# Patient Record
Sex: Female | Born: 1995 | Race: Black or African American | Hispanic: No | Marital: Single | Smoking: Never smoker
Health system: Southern US, Community
[De-identification: ages and names within clinical notes are randomized; demographics above are authoritative.]

## PROBLEM LIST (undated history)

## (undated) ENCOUNTER — Inpatient Hospital Stay (HOSPITAL_COMMUNITY): Payer: Self-pay

## (undated) DIAGNOSIS — E079 Disorder of thyroid, unspecified: Secondary | ICD-10-CM

## (undated) DIAGNOSIS — Z789 Other specified health status: Secondary | ICD-10-CM

## (undated) DIAGNOSIS — D649 Anemia, unspecified: Secondary | ICD-10-CM

## (undated) HISTORY — PX: NO PAST SURGERIES: SHX2092

## (undated) HISTORY — DX: Other specified health status: Z78.9

## (undated) HISTORY — DX: Anemia, unspecified: D64.9

## (undated) HISTORY — PX: WISDOM TOOTH EXTRACTION: SHX21

---
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
## 2017-07-07 ENCOUNTER — Ambulatory Visit: Payer: Medicaid Other | Attending: Internal Medicine | Admitting: Internal Medicine

## 2017-07-07 ENCOUNTER — Encounter: Payer: Self-pay | Admitting: Internal Medicine

## 2017-07-07 VITALS — BP 133/84 | HR 91 | Temp 98.3°F | Resp 16 | Wt 158.4 lb

## 2017-07-07 DIAGNOSIS — Z23 Encounter for immunization: Secondary | ICD-10-CM | POA: Insufficient documentation

## 2017-07-07 DIAGNOSIS — Z30011 Encounter for initial prescription of contraceptive pills: Secondary | ICD-10-CM

## 2017-07-07 DIAGNOSIS — F1290 Cannabis use, unspecified, uncomplicated: Secondary | ICD-10-CM

## 2017-07-07 DIAGNOSIS — Z30432 Encounter for removal of intrauterine contraceptive device: Secondary | ICD-10-CM | POA: Diagnosis not present

## 2017-07-07 DIAGNOSIS — Z308 Encounter for other contraceptive management: Secondary | ICD-10-CM | POA: Diagnosis not present

## 2017-07-07 DIAGNOSIS — Z124 Encounter for screening for malignant neoplasm of cervix: Secondary | ICD-10-CM | POA: Insufficient documentation

## 2017-07-07 DIAGNOSIS — Z2821 Immunization not carried out because of patient refusal: Secondary | ICD-10-CM

## 2017-07-07 DIAGNOSIS — Z3009 Encounter for other general counseling and advice on contraception: Secondary | ICD-10-CM | POA: Insufficient documentation

## 2017-07-07 HISTORY — DX: Cannabis use, unspecified, uncomplicated: F12.90

## 2017-07-07 LAB — POCT URINE PREGNANCY: PREG TEST UR: NEGATIVE

## 2017-07-07 MED ORDER — NORGESTIM-ETH ESTRAD TRIPHASIC 0.18/0.215/0.25 MG-35 MCG PO TABS: 1.0000 | ORAL_TABLET | Freq: Every day | ORAL | 11 refills | Status: DC

## 2017-07-07 NOTE — Progress Notes (Signed)
Patient ID: Mia CantorJanecia Resende, female    DOB: [birth year]  MRN: [id number]  CC: New Patient (Initial Visit) and Contraception (pill )   Subjective: Mia Mccoy is a 21 y.o. female who presents for new pt visit. Sister is with her. Her concerns today include:   Patient presents today requesting to have Mirena IUD removed and to be put on birth control pills instead. She is also due for a Pap smear and would like STD screening Pt is G0P0. -She has had Mirena in place for 5 years. She would like to have it removed because it causes intermittent sharp pains. She is not interested in having birth control implant -want to go to BCP -sexually active. 1 partner. Last sexual activity was in July 2018 -no vag dischg -random periods with Mirena - 2x this mth. Last few days. -no abnormal paps in past  Patient Active Problem List   Diagnosis Date Noted  . Immunization due 07/07/2017  . Family planning 07/07/2017     No current outpatient prescriptions on file prior to visit.   No current facility-administered medications on file prior to visit.     No Known Allergies  Social History   Social History  . Marital status: Single    Spouse name: N/A  . Number of children: N/A  . Years of education: N/A   Occupational History  . Not on file.   Social History Main Topics  . Smoking status: Never Smoker  . Smokeless tobacco: Never Used     Comment: Marijuana  . Alcohol use No  . Drug use: Yes    Types: Marijuana  . Sexual activity: Yes    Birth control/ protection: IUD   Other Topics Concern  . Not on file   Social History Narrative  . No narrative on file    History reviewed. No pertinent family history.  History reviewed. No pertinent surgical history.  ROS: Review of Systems  Constitutional: Negative.   Respiratory: Negative for chest tightness and shortness of breath.   Cardiovascular: Negative for chest pain.  Gastrointestinal: Negative for abdominal pain.    Hematological:       No past history of blood clots in the legs or lungs    PHYSICAL EXAM: BP 133/84   Pulse 91   Temp 98.3 F (36.8 C) (Oral)   Resp 16   Wt 158 lb 6.4 oz (71.8 kg)   SpO2 99%   110/60 Physical Exam General appearance - alert, well appearing, and in no distress Mental status - alert, oriented to person, place, and time, normal mood, behavior, speech, dress, motor activity, and thought processes Mouth - mucous membranes moist, pharynx normal without lesions Neck - supple, no significant adenopathy Chest - clear to auscultation, no wheezes, rales or rhonchi, symmetric air entry Heart - normal rate, regular rhythm, normal S1, S2, no murmurs, rubs, clicks or gallops Abdomen - soft, nontender, nondistended, no masses or organomegaly Breasts - breasts appear normal, no suspicious masses, no skin or nipple changes or axillary nodes Pelvic - CMA Pollock present: normal external genitalia, vulva, vagina, cervix, uterus and adnexa. IUD string was outside cervical oz. Using pelvic forceps, this was successfully pulled out in one piece and removed by PA MCclung. Pt tolerate procedure well Extremities -no LE edema   Results for orders placed or performed in visit on 07/07/17  POCT urine pregnancy  Result Value Ref Range   Preg Test, Ur Negative Negative    ASSESSMENT AND PLAN: 1.  Family planning We discussed birth control options. Patient advised that long-acting birth control has advantage of not having to remember to take birth control pills. She is not interested in implant or Depo-Medrol. She does not have any contraindication for birth control pills. -Patient can start birth control pills today. I went over with her how to take the pills -Patient advised of slight risk of developing blood clots in legs or lungs while on birth control. I went over signs and symptoms that should alert her to be seen. -IUD removed. - POCT urine pregnancy - HIV antibody (with reflex) -  Norgestimate-Ethinyl Estradiol Triphasic (ORTHO TRI-CYCLEN, 28,) 0.18/0.215/0.25 MG-35 MCG tablet; Take 1 tablet by mouth daily.  Dispense: 1 Package; Refill: 11  2. Encounter for removal of intrauterine contraceptive device (IUD) -successfully removed  3. Pap smear for cervical cancer screening - Cytology - PAP  4. Immunization due Tdap give  5. Influenza vaccination declined   6. Marijuana use -Counseled on quitting   Patient was given the opportunity to ask questions.  Patient verbalized understanding of the plan and was able to repeat key elements of the plan.   Orders Placed This Encounter  Procedures  . Tdap vaccine greater than or equal to 7yo IM  . HIV antibody (with reflex)  . POCT urine pregnancy     Requested Prescriptions   Signed Prescriptions Disp Refills  . Norgestimate-Ethinyl Estradiol Triphasic (ORTHO TRI-CYCLEN, 28,) 0.18/0.215/0.25 MG-35 MCG tablet 1 Package 11    Sig: Take 1 tablet by mouth daily.    Return if symptoms worsen or fail to improve.  Jonah Blue, MD, FACP

## 2017-07-07 NOTE — Patient Instructions (Signed)
Td Vaccine (Tetanus and Diphtheria): What You Need to Know 1. Why get vaccinated? Tetanus  and diphtheria are very serious diseases. They are rare in the [place] today, but people who do become infected often have severe complications. Td vaccine is used to protect adolescents and adults from both of these diseases. Both tetanus and diphtheria are infections caused by bacteria. Diphtheria spreads from person to person through coughing or sneezing. Tetanus-causing bacteria enter the body through cuts, scratches, or wounds. TETANUS (lockjaw) causes painful muscle tightening and stiffness, usually all over the body.  It can lead to tightening of muscles in the head and neck so you can't open your mouth, swallow, or sometimes even breathe. Tetanus kills about 1 out of every 10 people who are infected even after receiving the best medical care.  DIPHTHERIA can cause a thick coating to form in the back of the throat.  It can lead to breathing problems, paralysis, heart failure, and death.  Before vaccines, as many as 200,000 cases of diphtheria and hundreds of cases of tetanus were reported in the [place] each year. Since vaccination began, reports of cases for both diseases have dropped by about 99%. 2. Td vaccine Td vaccine can protect adolescents and adults from tetanus and diphtheria. Td is usually given as a booster dose every 10 years but it can also be given earlier after a severe and dirty wound or burn. Another vaccine, called Tdap, which protects against pertussis in addition to tetanus and diphtheria, is sometimes recommended instead of Td vaccine. Your doctor or the person giving you the vaccine can give you more information. Td may safely be given at the same time as other vaccines. 3. Some people should not get this vaccine  A person who has ever had a life-threatening allergic reaction after a previous dose of any tetanus or diphtheria containing vaccine, OR has a severe  allergy to any part of this vaccine, should not get Td vaccine. Tell the person giving the vaccine about any severe allergies.  Talk to your doctor if you: ? had severe pain or swelling after any vaccine containing diphtheria or tetanus, ? ever had a condition called Guillain Barre Syndrome (GBS), ? aren't feeling well on the day the shot is scheduled. 4. What are the risks from Td vaccine? With any medicine, including vaccines, there is a chance of side effects. These are usually mild and go away on their own. Serious reactions are also possible but are rare. Most people who get Td vaccine do not have any problems with it. Mild problems following Td vaccine: (Did not interfere with activities)  Pain where the shot was given (about 8 people in 10)  Redness or swelling where the shot was given (about 1 person in 4)  Mild fever (rare)  Headache (about 1 person in 4)  Tiredness (about 1 person in 4)  Moderate problems following Td vaccine: (Interfered with activities, but did not require medical attention)  Fever over 102F (rare)  Severe problems following Td vaccine: (Unable to perform usual activities; required medical attention)  Swelling, severe pain, bleeding and/or redness in the arm where the shot was given (rare).  Problems that could happen after any vaccine:  People sometimes faint after a medical procedure, including vaccination. Sitting or lying down for about 15 minutes can help prevent fainting, and injuries caused by a fall. Tell your doctor if you feel dizzy, or have vision changes or ringing in the ears.  Some people get   severe pain in the shoulder and have difficulty moving the arm where a shot was given. This happens very rarely.  Any medication can cause a severe allergic reaction. Such reactions from a vaccine are very rare, estimated at fewer than 1 in a million doses, and would happen within a few minutes to a few hours after the vaccination. As with any  medicine, there is a very remote chance of a vaccine causing a serious injury or death. The safety of vaccines is always being monitored. For more information, visit: www.cdc.gov/vaccinesafety/ 5. What if there is a serious reaction? What should I look for? Look for anything that concerns you, such as signs of a severe allergic reaction, very high fever, or unusual behavior. Signs of a severe allergic reaction can include hives, swelling of the face and throat, difficulty breathing, a fast heartbeat, dizziness, and weakness. These would usually start a few minutes to a few hours after the vaccination. What should I do?  If you think it is a severe allergic reaction or other emergency that can't wait, call 9-1-1 or get the person to the nearest hospital. Otherwise, call your doctor.  Afterward, the reaction should be reported to the Vaccine Adverse Event Reporting System (VAERS). Your doctor might file this report, or you can do it yourself through the VAERS web site at www.vaers.hhs.gov, or by calling 1-800-822-7967. ? VAERS does not give medical advice. 6. The National Vaccine Injury Compensation Program The National Vaccine Injury Compensation Program (VICP) is a federal program that was created to compensate people who may have been injured by certain vaccines. Persons who believe they may have been injured by a vaccine can learn about the program and about filing a claim by calling 1-800-338-2382 or visiting the VICP website at www.hrsa.gov/vaccinecompensation. There is a time limit to file a claim for compensation. 7. How can I learn more?  Ask your doctor. He or she can give you the vaccine package insert or suggest other sources of information.  Call your local or state health department.  Contact the Centers for Disease Control and Prevention (CDC): ? Call 1-800-232-4636 (1-800-CDC-INFO) ? Visit CDC's website at www.cdc.gov/vaccines CDC Td Vaccine VIS (12/18/15) This information is  not intended to replace advice given to you by your health care provider. Make sure you discuss any questions you have with your health care provider. Document Released: 06/22/2006 Document Revised: 05/15/2016 Document Reviewed: 05/15/2016 Elsevier Interactive Patient Education  2017 Elsevier Inc.  

## 2017-07-08 LAB — HIV ANTIBODY (ROUTINE TESTING W REFLEX): HIV Screen 4th Generation wRfx: NONREACTIVE

## 2017-07-09 ENCOUNTER — Other Ambulatory Visit: Payer: Self-pay | Admitting: Internal Medicine

## 2017-07-09 LAB — CYTOLOGY - PAP
BACTERIAL VAGINITIS: POSITIVE — AB
CANDIDA VAGINITIS: NEGATIVE
CHLAMYDIA, DNA PROBE: NEGATIVE
Diagnosis: NEGATIVE
HPV: NOT DETECTED
NEISSERIA GONORRHEA: NEGATIVE
TRICH (WINDOWPATH): NEGATIVE

## 2017-07-09 MED ORDER — METRONIDAZOLE 500 MG PO TABS: 500.0000 mg | ORAL_TABLET | Freq: Two times a day (BID) | ORAL | 0 refills | Status: DC

## 2017-07-10 ENCOUNTER — Telehealth: Payer: Self-pay

## 2017-07-10 NOTE — Telephone Encounter (Signed)
Contacted pt to go over pap and lab results pt is aware and doesn't have any questions or concerns

## 2018-06-23 ENCOUNTER — Encounter (HOSPITAL_COMMUNITY): Payer: Self-pay | Admitting: *Deleted

## 2018-06-23 ENCOUNTER — Inpatient Hospital Stay (HOSPITAL_COMMUNITY)
Admission: AD | Admit: 2018-06-23 | Discharge: 2018-06-23 | Disposition: A | Discharge status: Home / Self Care | Payer: Self-pay | Source: Ambulatory Visit | Attending: Obstetrics and Gynecology | Admitting: Obstetrics and Gynecology

## 2018-06-23 ENCOUNTER — Inpatient Hospital Stay (HOSPITAL_COMMUNITY): Payer: Self-pay

## 2018-06-23 ENCOUNTER — Other Ambulatory Visit: Payer: Self-pay

## 2018-06-23 DIAGNOSIS — O418X10 Other specified disorders of amniotic fluid and membranes, first trimester, not applicable or unspecified: Secondary | ICD-10-CM

## 2018-06-23 DIAGNOSIS — Z79899 Other long term (current) drug therapy: Secondary | ICD-10-CM | POA: Insufficient documentation

## 2018-06-23 DIAGNOSIS — O219 Vomiting of pregnancy, unspecified: Secondary | ICD-10-CM | POA: Insufficient documentation

## 2018-06-23 DIAGNOSIS — R1030 Lower abdominal pain, unspecified: Secondary | ICD-10-CM | POA: Insufficient documentation

## 2018-06-23 DIAGNOSIS — O468X1 Other antepartum hemorrhage, first trimester: Secondary | ICD-10-CM

## 2018-06-23 DIAGNOSIS — O26891 Other specified pregnancy related conditions, first trimester: Secondary | ICD-10-CM | POA: Insufficient documentation

## 2018-06-23 DIAGNOSIS — Z3A01 Less than 8 weeks gestation of pregnancy: Secondary | ICD-10-CM | POA: Insufficient documentation

## 2018-06-23 DIAGNOSIS — R109 Unspecified abdominal pain: Secondary | ICD-10-CM

## 2018-06-23 DIAGNOSIS — O208 Other hemorrhage in early pregnancy: Secondary | ICD-10-CM | POA: Insufficient documentation

## 2018-06-23 DIAGNOSIS — Z3491 Encounter for supervision of normal pregnancy, unspecified, first trimester: Secondary | ICD-10-CM

## 2018-06-23 HISTORY — DX: Disorder of thyroid, unspecified: E07.9

## 2018-06-23 LAB — URINALYSIS, ROUTINE W REFLEX MICROSCOPIC
BILIRUBIN URINE: NEGATIVE
Glucose, UA: NEGATIVE mg/dL
Hgb urine dipstick: NEGATIVE
KETONES UR: 20 mg/dL — AB
Leukocytes, UA: NEGATIVE
NITRITE: NEGATIVE
PH: 5 (ref 5.0–8.0)
Protein, ur: NEGATIVE mg/dL
SPECIFIC GRAVITY, URINE: 1.021 (ref 1.005–1.030)

## 2018-06-23 LAB — WET PREP, GENITAL
CLUE CELLS WET PREP: NONE SEEN
Sperm: NONE SEEN
Trich, Wet Prep: NONE SEEN
Yeast Wet Prep HPF POC: NONE SEEN

## 2018-06-23 LAB — CBC
HCT: 41.5 % (ref 36.0–46.0)
Hemoglobin: 13.6 g/dL (ref 12.0–15.0)
MCH: 25.7 pg — AB (ref 26.0–34.0)
MCHC: 32.8 g/dL (ref 30.0–36.0)
MCV: 78.4 fL — ABNORMAL LOW (ref 80.0–100.0)
PLATELETS: 282 10*3/uL (ref 150–400)
RBC: 5.29 MIL/uL — AB (ref 3.87–5.11)
RDW: 14.4 % (ref 11.5–15.5)
WBC: 8 10*3/uL (ref 4.0–10.5)
nRBC: 0 % (ref 0.0–0.2)

## 2018-06-23 LAB — HCG, QUANTITATIVE, PREGNANCY: HCG, BETA CHAIN, QUANT, S: 69879 m[IU]/mL — AB (ref <5)

## 2018-06-23 LAB — POCT PREGNANCY, URINE: PREG TEST UR: POSITIVE — AB

## 2018-06-23 LAB — ABO/RH: ABO/RH(D): O POS

## 2018-06-23 MED ORDER — DOXYLAMINE-PYRIDOXINE 10-10 MG PO TBEC: DELAYED_RELEASE_TABLET | ORAL | 5 refills | Status: DC

## 2018-06-23 MED ORDER — METOCLOPRAMIDE HCL 10 MG PO TABS: 10.0000 mg | ORAL_TABLET | Freq: Three times a day (TID) | ORAL | 2 refills | Status: DC

## 2018-06-23 NOTE — MAU Note (Addendum)
Pt c/o lower abdominal cramping on/off for approx two days; however no cramping today. Pain 5/10 when occurs. No vaginal bldg or d/c noted. Pt with occ HA's and nausea. Denies any other sx's at this time.   Adah Perl RN

## 2018-06-23 NOTE — Discharge Instructions (Signed)
[facility]  ° ° °[facility]       Phone: [phone] ° °[facility] Phone: [phone] ° °[facility] [place]  Phone: [phone] ° °[facility] [place]  Phone: [phone] ° °[facility] [place]  Phone: [phone] ° °[facility]       Phone: [phone] ° °[facility]    Phone: [phone]  ° °[facility] ([place]    Phone: [phone] ° °[facility]    Phone: [phone] ° °[facility]    Phone: [phone] ° °[facility]    Phone: [phone] ° °[facility]       Phone: [phone]  ° °[facility]  Phone: [phone] ° °[facility]    Phone: [phone] ° °[facility]   Phone: [phone] ° °[facility]      Phone: [phone] ° °[facility]    Phone: [phone] ° °

## 2018-06-23 NOTE — MAU Note (Signed)
Having pain in lower back and lower abd, going on for a few wks (once in a while, not currently). Cramps- feels like her period is going to start. Boobs are sore.  Might be pregnant, has not done a home test.

## 2018-06-23 NOTE — MAU Provider Note (Signed)
Chief Complaint: Abdominal Pain; Back Pain; and Possible Pregnancy   First Provider Initiated Contact with Patient 06/23/18 0839      SUBJECTIVE HPI: Mia Mccoy is a 22 y.o. G1P0 at [redacted]w[redacted]d by LMP who presents to maternity admissions reporting back and abdominal pain x 1 week, intermittent, last episode early this morning at 4 am.  The pain is low in the front of her abdomen, sharp and pressure pain. She has not tried any treatments. She is unsure of her LMP and was taking OCPs but missing pills often so having breakthrough spotting.  She has not taken a pregnancy test but suspects pregnancy. She reports a mild frontal h/a but denies any other symptoms.     HPI  Past Medical History:  Diagnosis Date  . Thyroid disorder    Past Surgical History:  Procedure Laterality Date  . WISDOM TOOTH EXTRACTION     Social History   Socioeconomic History  . Marital status: Single    Spouse name: Not on file  . Number of children: Not on file  . Years of education: Not on file  . Highest education level: Not on file  Occupational History  . Not on file  Social Needs  . Financial resource strain: Not on file  . Food insecurity:    Worry: Not on file    Inability: Not on file  . Transportation needs:    Medical: Not on file    Non-medical: Not on file  Tobacco Use  . Smoking status: Never Smoker  . Smokeless tobacco: Never Used  . Tobacco comment: Marijuana  Substance and Sexual Activity  . Alcohol use: No  . Drug use: Not Currently  . Sexual activity: Yes  Lifestyle  . Physical activity:    Days per week: Not on file    Minutes per session: Not on file  . Stress: Not on file  Relationships  . Social connections:    Talks on phone: Not on file    Gets together: Not on file    Attends religious service: Not on file    Active member of club or organization: Not on file    Attends meetings of clubs or organizations: Not on file    Relationship status: Not on file  . Intimate  partner violence:    Fear of current or ex partner: Not on file    Emotionally abused: Not on file    Physically abused: Not on file    Forced sexual activity: Not on file  Other Topics Concern  . Not on file  Social History Narrative  . Not on file   No current facility-administered medications on file prior to encounter.    Current Outpatient Medications on File Prior to Encounter  Medication Sig Dispense Refill  . metroNIDAZOLE (FLAGYL) 500 MG tablet Take 1 tablet (500 mg total) by mouth 2 (two) times daily. 14 tablet 0  . Norgestimate-Ethinyl Estradiol Triphasic (ORTHO TRI-CYCLEN, 28,) 0.18/0.215/0.25 MG-35 MCG tablet Take 1 tablet by mouth daily. 1 Package 11   No Known Allergies  ROS:  Review of Systems   I have reviewed patient's Past Medical Hx, Surgical Hx, Family Hx, Social Hx, medications and allergies.   Physical Exam   Patient Vitals for the past 24 hrs:  BP Temp Temp src Pulse Resp SpO2 Weight  06/23/18 0805 (!) 130/59 - - 93 - - -  06/23/18 0746 132/68 99.2 F (37.3 C) Oral 88 18 100 % 75.6 kg   Constitutional: Well-developed,  well-nourished female in no acute distress.  Cardiovascular: normal rate Respiratory: normal effort GI: Abd soft, non-tender. Pos BS x 4 MS: Extremities nontender, no edema, normal ROM Neurologic: Alert and oriented x 4.  GU: Neg CVAT.  PELVIC EXAM: Wet prep/GC collected by blind swab    LAB RESULTS Results for orders placed or performed during the hospital encounter of 06/23/18 (from the past 24 hour(s))  Urinalysis, Routine w reflex microscopic     Status: Abnormal   Collection Time: 06/23/18  7:53 AM  Result Value Ref Range   Color, Urine YELLOW YELLOW   APPearance CLEAR CLEAR   Specific Gravity, Urine 1.021 1.005 - 1.030   pH 5.0 5.0 - 8.0   Glucose, UA NEGATIVE NEGATIVE mg/dL   Hgb urine dipstick NEGATIVE NEGATIVE   Bilirubin Urine NEGATIVE NEGATIVE   Ketones, ur 20 (A) NEGATIVE mg/dL   Protein, ur NEGATIVE NEGATIVE  mg/dL   Nitrite NEGATIVE NEGATIVE   Leukocytes, UA NEGATIVE NEGATIVE  Pregnancy, urine POC     Status: Abnormal   Collection Time: 06/23/18  7:57 AM  Result Value Ref Range   Preg Test, Ur POSITIVE (A) NEGATIVE  Wet prep, genital     Status: Abnormal   Collection Time: 06/23/18  8:48 AM  Result Value Ref Range   Yeast Wet Prep HPF POC NONE SEEN NONE SEEN   Trich, Wet Prep NONE SEEN NONE SEEN   Clue Cells Wet Prep HPF POC NONE SEEN NONE SEEN   WBC, Wet Prep HPF POC FEW (A) NONE SEEN   Sperm NONE SEEN   CBC     Status: Abnormal   Collection Time: 06/23/18  9:01 AM  Result Value Ref Range   WBC 8.0 4.0 - 10.5 K/uL   RBC 5.29 (H) 3.87 - 5.11 MIL/uL   Hemoglobin 13.6 12.0 - 15.0 g/dL   HCT 16.1 09.6 - 04.5 %   MCV 78.4 (L) 80.0 - 100.0 fL   MCH 25.7 (L) 26.0 - 34.0 pg   MCHC 32.8 30.0 - 36.0 g/dL   RDW 40.9 81.1 - 91.4 %   Platelets 282 150 - 400 K/uL   nRBC 0.0 0.0 - 0.2 %  hCG, quantitative, pregnancy     Status: Abnormal   Collection Time: 06/23/18  9:01 AM  Result Value Ref Range   hCG, Beta Chain, Quant, S 69,879 (H) <5 mIU/mL  ABO/Rh     Status: None (Preliminary result)   Collection Time: 06/23/18  9:01 AM  Result Value Ref Range   ABO/RH(D)      O POS Performed at [facility], [address] [place], [place] [address]     --/--/O POS Performed at [facility], [address], [place], [place] [address]  [phone])  IMAGING US Ob Less Than 14 Weeks With Ob Transvaginal  Result Date: 06/23/2018 CLINICAL DATA:  Abdominal pain in pregnancy EXAM: OBSTETRIC <14 WK [place] AND TRANSVAGINAL OB US TECHNIQUE: Both transabdominal and transvaginal ultrasound examinations were performed for complete evaluation of the gestation as well as the maternal uterus, adnexal regions, and pelvic cul-de-sac. Transvaginal technique was performed to assess early pregnancy. COMPARISON:  None. FINDINGS: Intrauterine gestational sac: Single Yolk sac:  Visualized Embryo:  Visualized  Cardiac Activity: Visualized Heart Rate: 169 bpm MSD:   mm    w     d CRL:  6.81 mm   6 w   3 d  [place] EDC: 02/13/2019 Subchorionic hemorrhage:  Small subchorionic hemorrhage Maternal uterus/adnexae: No adnexal mass.  Trace free fluid. IMPRESSION: Six week 3 day intrauterine pregnancy. Fetal heart rate 169 beats per minute. Small subchorionic hemorrhage. Electronically Signed   By: Charlett Nose M.D.   On: 06/23/2018 09:55    MAU Management/MDM: Ordered labs and [place] and reviewed results.  IUP noted on today's [place] with small subchorionic hemorrhage.  Discussed results with pt in MAU. Pt to start prenatal care as desired. Rx for Diclegis but may not be covered until pt switches from Family Planning to Pregnancy Medicaid so Rx for Reglan 10 mg TID and HS PRN for pt mild nausea.  F/U with prenatal care.  Return to MAU with emergencies.  Pt discharged with strict return precautions.  ASSESSMENT 1. Normal IUP (intrauterine pregnancy) on prenatal ultrasound, first trimester   2. Abdominal pain during pregnancy, first trimester   3. Subchorionic hemorrhage of placenta in first trimester, single or unspecified fetus   4. Nausea and vomiting during pregnancy prior to [redacted] weeks gestation     PLAN Discharge home Allergies as of 06/23/2018   No Known Allergies     Medication List    STOP taking these medications   metroNIDAZOLE 500 MG tablet Commonly known as:  FLAGYL   Norgestimate-Ethinyl Estradiol Triphasic 0.18/0.215/0.25 MG-35 MCG tablet     TAKE these medications   Doxylamine-Pyridoxine 10-10 MG Tbec Take 2 tabs at bedtime. If needed, add another tab in the morning. If needed, add another tab in the afternoon, up to 4 tabs/day.   metoCLOPramide 10 MG tablet Commonly known as:  REGLAN Take 1 tablet (10 mg total) by mouth 4 (four) times daily -  before meals and at bedtime.      Follow-up Information    Prenatal provider of your choice Follow up.   Why:  See list provided           Sharen Counter Certified Nurse-Midwife 06/23/2018  10:34 AM

## 2018-06-24 LAB — GC/CHLAMYDIA PROBE AMP (~~LOC~~) NOT AT ARMC
CHLAMYDIA, DNA PROBE: NEGATIVE
NEISSERIA GONORRHEA: NEGATIVE

## 2018-06-24 LAB — HIV ANTIBODY (ROUTINE TESTING W REFLEX): HIV Screen 4th Generation wRfx: NONREACTIVE

## 2018-07-20 ENCOUNTER — Ambulatory Visit: Payer: Medicaid Other | Admitting: *Deleted

## 2018-07-20 ENCOUNTER — Other Ambulatory Visit: Payer: Self-pay

## 2018-07-20 DIAGNOSIS — Z3400 Encounter for supervision of normal first pregnancy, unspecified trimester: Secondary | ICD-10-CM | POA: Diagnosis not present

## 2018-07-20 LAB — POCT URINALYSIS DIPSTICK OB
BILIRUBIN UA: NEGATIVE
Glucose, UA: NEGATIVE
KETONES UA: NEGATIVE
Leukocytes, UA: NEGATIVE
NITRITE UA: NEGATIVE
PH UA: 6.5 (ref 5.0–8.0)
POC,PROTEIN,UA: NEGATIVE
RBC UA: NEGATIVE
SPEC GRAV UA: 1.02 (ref 1.010–1.025)
UROBILINOGEN UA: 1 U/dL

## 2018-07-20 MED ORDER — VITAFOL GUMMIES 3.33-0.333-34.8 MG PO CHEW: 3.0000 | CHEWABLE_TABLET | Freq: Every day | ORAL | 3 refills | Status: DC

## 2018-07-20 MED ORDER — ONE-A-DAY WOMENS PRENATAL 1 28-0.8-235 MG PO CAPS: 1.0000 | ORAL_CAPSULE | Freq: Every day | ORAL | 0 refills | Status: DC

## 2018-07-20 NOTE — Progress Notes (Signed)
   PRENATAL INTAKE SUMMARY  [name] presents today New OB Nurse Interview.  OB History    Gravida  1   Para      Term      Preterm      AB      Living        SAB      TAB      Ectopic      Multiple      Live Births             I have reviewed the patient's medical, obstetrical, social, and family histories, medications, and available lab results.  SUBJECTIVE She has no unusual complaints.  OBJECTIVE Initial nurse interview for history and lab work (New OB).  EDD: 02/13/2019 confirm by ultrasound 06/23/18 GA: 109W2D G1P0  GENERAL APPEARANCE: alert, well appearing, in no apparent distress, oriented to person, place and time, overweight.   ASSESSMENT Normal pregnancy.  PLAN Prenatal care-[facility] Pnl/HIV  OB Urine Culture/Dip GC/CT/PAP at next visit with CNM HgbEval SMA CF A1C Prenatal gummies sent to pharmacy.  Clovis PuMartin, Burnard Enis L, RN

## 2018-07-23 LAB — CULTURE, OB URINE

## 2018-07-23 LAB — URINE CULTURE, OB REFLEX

## 2018-07-27 LAB — SICKLE CELL SCREEN: Sickle Cell Screen: NEGATIVE

## 2018-07-27 LAB — SMN1 COPY NUMBER ANALYSIS (SMA CARRIER SCREENING)

## 2018-07-27 LAB — OBSTETRIC PANEL, INCLUDING HIV
ANTIBODY SCREEN: NEGATIVE
BASOS: 0 %
Basophils Absolute: 0 10*3/uL (ref 0.0–0.2)
EOS (ABSOLUTE): 0.1 10*3/uL (ref 0.0–0.4)
EOS: 1 %
HEMATOCRIT: 37.2 % (ref 34.0–46.6)
HEMOGLOBIN: 12 g/dL (ref 11.1–15.9)
HEP B S AG: NEGATIVE
HIV Screen 4th Generation wRfx: NONREACTIVE
Immature Grans (Abs): 0 10*3/uL (ref 0.0–0.1)
Immature Granulocytes: 0 %
Lymphocytes Absolute: 2.4 10*3/uL (ref 0.7–3.1)
Lymphs: 26 %
MCH: 25.8 pg — ABNORMAL LOW (ref 26.6–33.0)
MCHC: 32.3 g/dL (ref 31.5–35.7)
MCV: 80 fL (ref 79–97)
MONOCYTES: 6 %
Monocytes Absolute: 0.5 10*3/uL (ref 0.1–0.9)
Neutrophils Absolute: 6.1 10*3/uL (ref 1.4–7.0)
Neutrophils: 67 %
Platelets: 324 10*3/uL (ref 150–450)
RBC: 4.66 x10E6/uL (ref 3.77–5.28)
RDW: 14.2 % (ref 12.3–15.4)
RH TYPE: POSITIVE
RPR: NONREACTIVE
RUBELLA: 11.5 {index} (ref >0.99)
WBC: 9.1 10*3/uL (ref 3.4–10.8)

## 2018-07-27 LAB — CYSTIC FIBROSIS MUTATION 97: Interpretation: NOT DETECTED

## 2018-07-27 LAB — HEMOGLOBIN A1C
Est. average glucose Bld gHb Est-mCnc: 114 mg/dL
Hgb A1c MFr Bld: 5.6 % (ref 4.8–5.6)

## 2018-07-29 ENCOUNTER — Encounter: Payer: Self-pay | Admitting: Obstetrics and Gynecology

## 2018-08-13 ENCOUNTER — Other Ambulatory Visit (HOSPITAL_COMMUNITY)
Admission: RE | Admit: 2018-08-13 | Discharge: 2018-08-13 | Disposition: A | Discharge status: Home / Self Care | Payer: Medicaid Other | Source: Ambulatory Visit | Attending: Obstetrics and Gynecology | Admitting: Obstetrics and Gynecology

## 2018-08-13 ENCOUNTER — Ambulatory Visit (INDEPENDENT_AMBULATORY_CARE_PROVIDER_SITE_OTHER): Payer: Medicaid Other | Admitting: Family

## 2018-08-13 ENCOUNTER — Encounter: Payer: Self-pay | Admitting: Family

## 2018-08-13 ENCOUNTER — Encounter: Payer: Self-pay | Admitting: General Practice

## 2018-08-13 VITALS — BP 117/76 | HR 90 | Wt 170.6 lb

## 2018-08-13 DIAGNOSIS — Z8639 Personal history of other endocrine, nutritional and metabolic disease: Secondary | ICD-10-CM | POA: Diagnosis not present

## 2018-08-13 DIAGNOSIS — Z363 Encounter for antenatal screening for malformations: Secondary | ICD-10-CM

## 2018-08-13 DIAGNOSIS — Z3400 Encounter for supervision of normal first pregnancy, unspecified trimester: Secondary | ICD-10-CM | POA: Diagnosis not present

## 2018-08-13 DIAGNOSIS — Z3401 Encounter for supervision of normal first pregnancy, first trimester: Secondary | ICD-10-CM

## 2018-08-13 DIAGNOSIS — Z3481 Encounter for supervision of other normal pregnancy, first trimester: Secondary | ICD-10-CM | POA: Diagnosis not present

## 2018-08-13 NOTE — Patient Instructions (Signed)

## 2018-08-13 NOTE — Progress Notes (Signed)
  Subjective:    Mia Mccoy is a G1P0 6985w5d being seen today for Mia first obstetrical visit.  Here with Mia Mccoy  Mia obstetrical history is significant for first pregnancy.  Reports history of thyroid condition, had to take pills.  Does not recall name of condition or where received services. Patient does intend to breast feed. Pregnancy history fully reviewed.  Patient reports no complaints.  Vitals:   08/13/18 1011  BP: 117/76  Pulse: 90  Weight: 170 lb 9.6 oz (77.4 kg)    HISTORY: OB History  Gravida Para Term Preterm AB Living  1            SAB TAB Ectopic Multiple Live Births               # Outcome Date GA Lbr Len/2nd Weight Sex Delivery Anes PTL Lv  1 Current            Past Medical History:  Diagnosis Date  . Medical history non-contributory   . Thyroid disorder    Past Surgical History:  Procedure Laterality Date  . NO PAST SURGERIES    . WISDOM TOOTH EXTRACTION     No family history on file.   Exam   BP 117/76   Pulse 90   Wt 170 lb 9.6 oz (77.4 kg)   LMP 05/03/2018 (Approximate)   BMI 34.46 kg/m  Uterine Size: size equals dates  Pelvic Exam:    Perineum: No Hemorrhoids, Normal Perineum   Vulva: normal   Vagina:  normal mucosa, normal discharge, no palpable nodules   pH: Not done   Cervix: Bleeding following Pap, no cervical motion tenderness and no lesions   Adnexa: normal adnexa and no mass, fullness, tenderness   Bony Pelvis: Adequate  System: Breast:  No nipple retraction or dimpling, No nipple discharge or bleeding, No axillary or supraclavicular adenopathy, Normal to palpation without dominant masses   Skin: normal coloration and turgor, no rashes    Neurologic: negative   Extremities: normal strength, tone, and muscle mass   HEENT neck supple with midline trachea and thyroid without masses   Mouth/Teeth mucous membranes moist, pharynx normal without lesions   Neck supple and no masses   Cardiovascular: regular rate and rhythm, no  murmurs or gallops   Respiratory:  appears well, vitals normal, no respiratory distress, acyanotic, normal RR, neck free of mass or lymphadenopathy, chest clear, no wheezing, crepitations, rhonchi, normal symmetric air entry   Abdomen: soft, non-tender; bowel sounds normal; no masses,  no organomegaly   Urinary: urethral meatus normal     Assessment:    Pregnancy: G1P0 Patient Active Problem List   Diagnosis Date Noted  . Supervision of normal first pregnancy, antepartum 07/20/2018  . Immunization due 07/07/2017  . Family planning 07/07/2017  . Marijuana use 07/07/2017        Plan:     Initial labs reviewed.  Collected pap smear and STI screening. Prenatal vitamins. Problem list reviewed and updated. Genetic Screening discussed Panorama: ordered.  Ultrasound discussed; fetal survey: ordered.  Follow up in 4-5 weeks.  Eino FarberWalidah N Karim-Rhoades 08/13/2018

## 2018-08-14 LAB — TSH: TSH: 1.18 u[IU]/mL (ref 0.450–4.500)

## 2018-08-18 LAB — CYTOLOGY - PAP: DIAGNOSIS: NEGATIVE

## 2018-08-24 ENCOUNTER — Encounter: Payer: Self-pay | Admitting: General Practice

## 2018-09-10 ENCOUNTER — Encounter (HOSPITAL_COMMUNITY): Payer: Self-pay

## 2018-09-17 ENCOUNTER — Ambulatory Visit (INDEPENDENT_AMBULATORY_CARE_PROVIDER_SITE_OTHER): Payer: Medicaid Other | Admitting: Obstetrics and Gynecology

## 2018-09-17 ENCOUNTER — Ambulatory Visit (HOSPITAL_COMMUNITY)
Admission: RE | Admit: 2018-09-17 | Discharge: 2018-09-17 | Disposition: A | Discharge status: Home / Self Care | Payer: Medicaid Other | Source: Ambulatory Visit | Attending: Family | Admitting: Family

## 2018-09-17 ENCOUNTER — Encounter: Payer: Self-pay | Admitting: Obstetrics and Gynecology

## 2018-09-17 ENCOUNTER — Other Ambulatory Visit: Payer: Self-pay | Admitting: Family

## 2018-09-17 ENCOUNTER — Other Ambulatory Visit (HOSPITAL_COMMUNITY): Payer: Self-pay | Admitting: *Deleted

## 2018-09-17 VITALS — BP 117/74 | HR 99 | Wt 179.6 lb

## 2018-09-17 DIAGNOSIS — Z3400 Encounter for supervision of normal first pregnancy, unspecified trimester: Secondary | ICD-10-CM

## 2018-09-17 DIAGNOSIS — O99212 Obesity complicating pregnancy, second trimester: Secondary | ICD-10-CM | POA: Diagnosis not present

## 2018-09-17 DIAGNOSIS — Z363 Encounter for antenatal screening for malformations: Secondary | ICD-10-CM

## 2018-09-17 DIAGNOSIS — Z3A18 18 weeks gestation of pregnancy: Secondary | ICD-10-CM

## 2018-09-17 DIAGNOSIS — Z3402 Encounter for supervision of normal first pregnancy, second trimester: Secondary | ICD-10-CM

## 2018-09-17 DIAGNOSIS — Z362 Encounter for other antenatal screening follow-up: Secondary | ICD-10-CM

## 2018-09-17 MED ORDER — TERCONAZOLE 0.8 % VA CREA: 1.0000 | TOPICAL_CREAM | Freq: Every day | VAGINAL | 0 refills | Status: DC

## 2018-09-17 NOTE — Progress Notes (Signed)
   PRENATAL VISIT NOTE  Subjective:  Mia Mccoy is a 23 y.o. G1P0 at [redacted]w[redacted]d being seen today for ongoing prenatal care.  She is currently monitored for the following issues for this low-risk pregnancy and has Immunization due; Family planning; Marijuana use; and Supervision of normal first pregnancy, antepartum on their problem list.  Patient reports vaginitis concerning for yeast infection.  Contractions: Not present. Vag. Bleeding: None.  Movement: Present. Denies leaking of fluid.   The following portions of the patient's history were reviewed and updated as appropriate: allergies, current medications, past family history, past medical history, past social history, past surgical history and problem list. Problem list updated.  Objective:   Vitals:   09/17/18 1026  BP: 117/74  Pulse: 99  Weight: 179 lb 9.6 oz (81.5 kg)    Fetal Status: Fetal Heart Rate (bpm): 153   Movement: Present     General:  Alert, oriented and cooperative. Patient is in no acute distress.  Skin: Skin is warm and dry. No rash noted.   Cardiovascular: Normal heart rate noted  Respiratory: Normal respiratory effort, no problems with respiration noted  Abdomen: Soft, gravid, appropriate for gestational age.  Pain/Pressure: Absent     Pelvic: Cervical exam deferred        Extremities: Normal range of motion.  Edema: None  Mental Status: Normal mood and affect. Normal behavior. Normal judgment and thought content.   Assessment and Plan:  Pregnancy: G1P0 at [redacted]w[redacted]d  1. Supervision of normal first pregnancy, antepartum Patient is doing well with vaginal pruritis- Rx provided for treatment of yeast infection AFP today Patient had anatomy ultrasound today- report not yet available but patient reports follow up scheduled due to incomplete anatomy - AFP, Serum, Open Spina Bifida  Preterm labor symptoms and general obstetric precautions including but not limited to vaginal bleeding, contractions, leaking of fluid  and fetal movement were reviewed in detail with the patient. Please refer to After Visit Summary for other counseling recommendations.  No follow-ups on file.  Future Appointments  Date Time Provider Department Center  10/22/2018  8:15 AM [facility] [place] [facility]    Catalina Antigua, MD

## 2018-09-19 LAB — AFP, SERUM, OPEN SPINA BIFIDA
AFP MOM: 1.44
AFP Value: 65.5 ng/mL
Gest. Age on Collection Date: 18.5 weeks
Maternal Age At EDD: 23.9 yr
OSBR RISK 1 IN: 6441
Test Results:: NEGATIVE
WEIGHT: 179 [lb_av]

## 2018-10-15 ENCOUNTER — Ambulatory Visit (INDEPENDENT_AMBULATORY_CARE_PROVIDER_SITE_OTHER): Payer: Medicaid Other | Admitting: Family

## 2018-10-15 VITALS — BP 109/69 | HR 94 | Wt 182.0 lb

## 2018-10-15 DIAGNOSIS — Z3402 Encounter for supervision of normal first pregnancy, second trimester: Secondary | ICD-10-CM

## 2018-10-15 DIAGNOSIS — Z3400 Encounter for supervision of normal first pregnancy, unspecified trimester: Secondary | ICD-10-CM

## 2018-10-15 NOTE — Patient Instructions (Signed)
Third Trimester of Pregnancy The third trimester is from week 28 through week 40 (months 7 through 9). The third trimester is a time when the unborn baby (fetus) is growing rapidly. At the end of the ninth month, the fetus is about 20 inches in length and weighs 6-10 pounds. Body changes during your third trimester Your body will continue to go through many changes during pregnancy. The changes vary from woman to woman. During the third trimester:  Your weight will continue to increase. You can expect to gain 25-35 pounds (11-16 kg) by the end of the pregnancy.  You may begin to get stretch marks on your hips, abdomen, and breasts.  You may urinate more often because the fetus is moving lower into your pelvis and pressing on your bladder.  You may develop or continue to have heartburn. This is caused by increased hormones that slow down muscles in the digestive tract.  You may develop or continue to have constipation because increased hormones slow digestion and cause the muscles that push waste through your intestines to relax.  You may develop hemorrhoids. These are swollen veins (varicose veins) in the rectum that can itch or be painful.  You may develop swollen, bulging veins (varicose veins) in your legs.  You may have increased body aches in the pelvis, back, or thighs. This is due to weight gain and increased hormones that are relaxing your joints.  You may have changes in your hair. These can include thickening of your hair, rapid growth, and changes in texture. Some women also have hair loss during or after pregnancy, or hair that feels dry or thin. Your hair will most likely return to normal after your baby is born.  Your breasts will continue to grow and they will continue to become tender. A yellow fluid (colostrum) may leak from your breasts. This is the first milk you are producing for your baby.  Your belly button may stick out.  You may notice more swelling in your hands,  face, or ankles.  You may have increased tingling or numbness in your hands, arms, and legs. The skin on your belly may also feel numb.  You may feel short of breath because of your expanding uterus.  You may have more problems sleeping. This can be caused by the size of your belly, increased need to urinate, and an increase in your body's metabolism.  You may notice the fetus "dropping," or moving lower in your abdomen (lightening).  You may have increased vaginal discharge.  You may notice your joints feel loose and you may have pain around your pelvic bone. What to expect at prenatal visits You will have prenatal exams every 2 weeks until week 36. Then you will have weekly prenatal exams. During a routine prenatal visit:  You will be weighed to make sure you and the baby are growing normally.  Your blood pressure will be taken.  Your abdomen will be measured to track your baby's growth.  The fetal heartbeat will be listened to.  Any test results from the previous visit will be discussed.  You may have a cervical check near your due date to see if your cervix has softened or thinned (effaced).  You will be tested for Group B streptococcus. This happens between 35 and 37 weeks. Your health care provider may ask you:  What your birth plan is.  How you are feeling.  If you are feeling the baby move.  If you have had any abnormal   symptoms, such as leaking fluid, bleeding, severe headaches, or abdominal cramping.  If you are using any tobacco products, including cigarettes, chewing tobacco, and electronic cigarettes.  If you have any questions. Other tests or screenings that may be performed during your third trimester include:  Blood tests that check for low iron levels (anemia).  Fetal testing to check the health, activity level, and growth of the fetus. Testing is done if you have certain medical conditions or if there are problems during the pregnancy.  Nonstress test  (NST). This test checks the health of your baby to make sure there are no signs of problems, such as the baby not getting enough oxygen. During this test, a belt is placed around your belly. The baby is made to move, and its heart rate is monitored during movement. What is false labor? False labor is a condition in which you feel small, irregular tightenings of the muscles in the womb (contractions) that usually go away with rest, changing position, or drinking water. These are called Braxton Hicks contractions. Contractions may last for hours, days, or even weeks before true labor sets in. If contractions come at regular intervals, become more frequent, increase in intensity, or become painful, you should see your health care provider. What are the signs of labor?  Abdominal cramps.  Regular contractions that start at 10 minutes apart and become stronger and more frequent with time.  Contractions that start on the top of the uterus and spread down to the lower abdomen and back.  Increased pelvic pressure and dull back pain.  A watery or bloody mucus discharge that comes from the vagina.  Leaking of amniotic fluid. This is also known as your "water breaking." It could be a slow trickle or a gush. Let your health care provider know if it has a color or strange odor. If you have any of these signs, call your health care provider right away, even if it is before your due date. Follow these instructions at home: Medicines  Follow your health care provider's instructions regarding medicine use. Specific medicines may be either safe or unsafe to take during pregnancy.  Take a prenatal vitamin that contains at least 600 micrograms (mcg) of folic acid.  If you develop constipation, try taking a stool softener if your health care provider approves. Eating and drinking   Eat a balanced diet that includes fresh fruits and vegetables, whole grains, good sources of protein such as meat, eggs, or tofu,  and low-fat dairy. Your health care provider will help you determine the amount of weight gain that is right for you.  Avoid raw meat and uncooked cheese. These carry germs that can cause birth defects in the baby.  If you have low calcium intake from food, talk to your health care provider about whether you should take a daily calcium supplement.  Eat four or five small meals rather than three large meals a day.  Limit foods that are high in fat and processed sugars, such as fried and sweet foods.  To prevent constipation: ? Drink enough fluid to keep your urine clear or pale yellow. ? Eat foods that are high in fiber, such as fresh fruits and vegetables, whole grains, and beans. Activity  Exercise only as directed by your health care provider. Most women can continue their usual exercise routine during pregnancy. Try to exercise for 30 minutes at least 5 days a week. Stop exercising if you experience uterine contractions.  Avoid heavy lifting.  Do   not exercise in extreme heat or humidity, or at high altitudes.  Wear low-heel, comfortable shoes.  Practice good posture.  You may continue to have sex unless your health care provider tells you otherwise. Relieving pain and discomfort  Take frequent breaks and rest with your legs elevated if you have leg cramps or low back pain.  Take warm sitz baths to soothe any pain or discomfort caused by hemorrhoids. Use hemorrhoid cream if your health care provider approves.  Wear a good support bra to prevent discomfort from breast tenderness.  If you develop varicose veins: ? Wear support pantyhose or compression stockings as told by your healthcare provider. ? Elevate your feet for 15 minutes, 3-4 times a day. Prenatal care  Write down your questions. Take them to your prenatal visits.  Keep all your prenatal visits as told by your health care provider. This is important. Safety  Wear your seat belt at all times when driving.  Make  a list of emergency phone numbers, including numbers for family, friends, the hospital, and police and fire departments. General instructions  Avoid cat litter boxes and soil used by cats. These carry germs that can cause birth defects in the baby. If you have a cat, ask someone to clean the litter box for you.  Do not travel far distances unless it is absolutely necessary and only with the approval of your health care provider.  Do not use hot tubs, steam rooms, or saunas.  Do not drink alcohol.  Do not use any products that contain nicotine or tobacco, such as cigarettes and e-cigarettes. If you need help quitting, ask your health care provider.  Do not use any medicinal herbs or unprescribed drugs. These chemicals affect the formation and growth of the baby.  Do not douche or use tampons or scented sanitary pads.  Do not cross your legs for long periods of time.  To prepare for the arrival of your baby: ? Take prenatal classes to understand, practice, and ask questions about labor and delivery. ? Make a trial run to the hospital. ? Visit the hospital and tour the maternity area. ? Arrange for maternity or paternity leave through employers. ? Arrange for family and friends to take care of pets while you are in the hospital. ? Purchase a rear-facing car seat and make sure you know how to install it in your car. ? Pack your hospital bag. ? Prepare the baby's nursery. Make sure to remove all pillows and stuffed animals from the baby's crib to prevent suffocation.  Visit your dentist if you have not gone during your pregnancy. Use a soft toothbrush to brush your teeth and be gentle when you floss. Contact a health care provider if:  You are unsure if you are in labor or if your water has broken.  You become dizzy.  You have mild pelvic cramps, pelvic pressure, or nagging pain in your abdominal area.  You have lower back pain.  You have persistent nausea, vomiting, or  diarrhea.  You have an unusual or bad smelling vaginal discharge.  You have pain when you urinate. Get help right away if:  Your water breaks before 37 weeks.  You have regular contractions less than 5 minutes apart before 37 weeks.  You have a fever.  You are leaking fluid from your vagina.  You have spotting or bleeding from your vagina.  You have severe abdominal pain or cramping.  You have rapid weight loss or weight gain.  You have   shortness of breath with chest pain.  You notice sudden or extreme swelling of your face, hands, ankles, feet, or legs.  Your baby makes fewer than 10 movements in 2 hours.  You have severe headaches that do not go away when you take medicine.  You have vision changes. Summary  The third trimester is from week 28 through week 40, months 7 through 9. The third trimester is a time when the unborn baby (fetus) is growing rapidly.  During the third trimester, your discomfort may increase as you and your baby continue to gain weight. You may have abdominal, leg, and back pain, sleeping problems, and an increased need to urinate.  During the third trimester your breasts will keep growing and they will continue to become tender. A yellow fluid (colostrum) may leak from your breasts. This is the first milk you are producing for your baby.  False labor is a condition in which you feel small, irregular tightenings of the muscles in the womb (contractions) that eventually go away. These are called Braxton Hicks contractions. Contractions may last for hours, days, or even weeks before true labor sets in.  Signs of labor can include: abdominal cramps; regular contractions that start at 10 minutes apart and become stronger and more frequent with time; watery or bloody mucus discharge that comes from the vagina; increased pelvic pressure and dull back pain; and leaking of amniotic fluid. This information is not intended to replace advice given to you by your  health care provider. Make sure you discuss any questions you have with your health care provider. Document Released: 08/19/2001 Document Revised: 09/30/2016 Document Reviewed: 09/30/2016 Elsevier Interactive Patient Education  2019 [employer]  

## 2018-10-15 NOTE — Progress Notes (Signed)
   PRENATAL VISIT NOTE  Subjective:  Mia Mccoy is a 23 y.o. G1P0 at [redacted]w[redacted]d being seen today for ongoing prenatal care.  She is currently monitored for the following issues for this low-risk pregnancy and has Immunization due; Family planning; Marijuana use; and Supervision of normal first pregnancy, antepartum on their problem list.  Patient reports no complaints.  Contractions: Not present. Vag. Bleeding: None.  Movement: Present. Denies leaking of fluid.   The following portions of the patient's history were reviewed and updated as appropriate: allergies, current medications, past family history, past medical history, past social history, past surgical history and problem list. Problem list updated.  Objective:   Vitals:   10/15/18 0817  BP: 109/69  Pulse: 94  Weight: 182 lb (82.6 kg)    Fetal Status: Fetal Heart Rate (bpm): 160 Fundal Height: 23 cm Movement: Present     General:  Alert, oriented and cooperative. Patient is in no acute distress.  Skin: Skin is warm and dry. No rash noted.   Cardiovascular: Normal heart rate noted  Respiratory: Normal respiratory effort, no problems with respiration noted  Abdomen: Soft, gravid, appropriate for gestational age.  Pain/Pressure: Absent     Pelvic: Cervical exam deferred        Extremities: Normal range of motion.  Edema: None  Mental Status: Normal mood and affect. Normal behavior. Normal judgment and thought content.   Assessment and Plan:  Pregnancy: G1P0 at [redacted]w[redacted]d  1. Encounter for supervision of normal first pregnancy in second trimester - Reviewed normal weight gain for height and dietary strategies (I.e. decrease simple carbs, skittles, and breads) - Follow-up ultrasound for incomplete anatomy already scheduled  Preterm labor symptoms and general obstetric precautions including but not limited to vaginal bleeding, contractions, leaking of fluid and fetal movement were reviewed in detail with the patient. Please refer  to After Visit Summary for other counseling recommendations.  Return in 4 weeks (on 11/12/2018).  Future Appointments  Date Time Provider Department Center  10/22/2018  8:15 AM [facility] [place] 4 [facility]  11/12/2018  8:10 AM Bronson Curb, Kae Heller, CNM [facility] None    Amedeo Gory, CNM

## 2018-10-22 ENCOUNTER — Ambulatory Visit (HOSPITAL_COMMUNITY)
Admission: RE | Admit: 2018-10-22 | Discharge: 2018-10-22 | Disposition: A | Discharge status: Home / Self Care | Payer: Medicaid Other | Source: Ambulatory Visit | Attending: Family | Admitting: Family

## 2018-10-22 DIAGNOSIS — Z362 Encounter for other antenatal screening follow-up: Secondary | ICD-10-CM

## 2018-10-22 DIAGNOSIS — Z3A23 23 weeks gestation of pregnancy: Secondary | ICD-10-CM

## 2018-10-22 DIAGNOSIS — O99212 Obesity complicating pregnancy, second trimester: Secondary | ICD-10-CM | POA: Diagnosis not present

## 2018-11-12 ENCOUNTER — Other Ambulatory Visit: Payer: Self-pay

## 2018-11-12 ENCOUNTER — Ambulatory Visit (INDEPENDENT_AMBULATORY_CARE_PROVIDER_SITE_OTHER): Payer: Medicaid Other | Admitting: Family

## 2018-11-12 VITALS — BP 111/73 | HR 94 | Wt 188.0 lb

## 2018-11-12 DIAGNOSIS — Z3400 Encounter for supervision of normal first pregnancy, unspecified trimester: Secondary | ICD-10-CM

## 2018-11-12 DIAGNOSIS — Z3A26 26 weeks gestation of pregnancy: Secondary | ICD-10-CM

## 2018-11-12 DIAGNOSIS — Z3402 Encounter for supervision of normal first pregnancy, second trimester: Secondary | ICD-10-CM

## 2018-11-12 NOTE — Patient Instructions (Signed)
Third Trimester of Pregnancy The third trimester is from week 28 through week 40 (months 7 through 9). The third trimester is a time when the unborn baby (fetus) is growing rapidly. At the end of the ninth month, the fetus is about 20 inches in length and weighs 6-10 pounds. Body changes during your third trimester Your body will continue to go through many changes during pregnancy. The changes vary from woman to woman. During the third trimester:  Your weight will continue to increase. You can expect to gain 25-35 pounds (11-16 kg) by the end of the pregnancy.  You may begin to get stretch marks on your hips, abdomen, and breasts.  You may urinate more often because the fetus is moving lower into your pelvis and pressing on your bladder.  You may develop or continue to have heartburn. This is caused by increased hormones that slow down muscles in the digestive tract.  You may develop or continue to have constipation because increased hormones slow digestion and cause the muscles that push waste through your intestines to relax.  You may develop hemorrhoids. These are swollen veins (varicose veins) in the rectum that can itch or be painful.  You may develop swollen, bulging veins (varicose veins) in your legs.  You may have increased body aches in the pelvis, back, or thighs. This is due to weight gain and increased hormones that are relaxing your joints.  You may have changes in your hair. These can include thickening of your hair, rapid growth, and changes in texture. Some women also have hair loss during or after pregnancy, or hair that feels dry or thin. Your hair will most likely return to normal after your baby is born.  Your breasts will continue to grow and they will continue to become tender. A yellow fluid (colostrum) may leak from your breasts. This is the first milk you are producing for your baby.  Your belly button may stick out.  You may notice more swelling in your hands,  face, or ankles.  You may have increased tingling or numbness in your hands, arms, and legs. The skin on your belly may also feel numb.  You may feel short of breath because of your expanding uterus.  You may have more problems sleeping. This can be caused by the size of your belly, increased need to urinate, and an increase in your body's metabolism.  You may notice the fetus "dropping," or moving lower in your abdomen (lightening).  You may have increased vaginal discharge.  You may notice your joints feel loose and you may have pain around your pelvic bone. What to expect at prenatal visits You will have prenatal exams every 2 weeks until week 36. Then you will have weekly prenatal exams. During a routine prenatal visit:  You will be weighed to make sure you and the baby are growing normally.  Your blood pressure will be taken.  Your abdomen will be measured to track your baby's growth.  The fetal heartbeat will be listened to.  Any test results from the previous visit will be discussed.  You may have a cervical check near your due date to see if your cervix has softened or thinned (effaced).  You will be tested for Group B streptococcus. This happens between 35 and 37 weeks. Your health care provider may ask you:  What your birth plan is.  How you are feeling.  If you are feeling the baby move.  If you have had any abnormal   symptoms, such as leaking fluid, bleeding, severe headaches, or abdominal cramping.  If you are using any tobacco products, including cigarettes, chewing tobacco, and electronic cigarettes.  If you have any questions. Other tests or screenings that may be performed during your third trimester include:  Blood tests that check for low iron levels (anemia).  Fetal testing to check the health, activity level, and growth of the fetus. Testing is done if you have certain medical conditions or if there are problems during the pregnancy.  Nonstress test  (NST). This test checks the health of your baby to make sure there are no signs of problems, such as the baby not getting enough oxygen. During this test, a belt is placed around your belly. The baby is made to move, and its heart rate is monitored during movement. What is false labor? False labor is a condition in which you feel small, irregular tightenings of the muscles in the womb (contractions) that usually go away with rest, changing position, or drinking water. These are called Braxton Hicks contractions. Contractions may last for hours, days, or even weeks before true labor sets in. If contractions come at regular intervals, become more frequent, increase in intensity, or become painful, you should see your health care provider. What are the signs of labor?  Abdominal cramps.  Regular contractions that start at 10 minutes apart and become stronger and more frequent with time.  Contractions that start on the top of the uterus and spread down to the lower abdomen and back.  Increased pelvic pressure and dull back pain.  A watery or bloody mucus discharge that comes from the vagina.  Leaking of amniotic fluid. This is also known as your "water breaking." It could be a slow trickle or a gush. Let your health care provider know if it has a color or strange odor. If you have any of these signs, call your health care provider right away, even if it is before your due date. Follow these instructions at home: Medicines  Follow your health care provider's instructions regarding medicine use. Specific medicines may be either safe or unsafe to take during pregnancy.  Take a prenatal vitamin that contains at least 600 micrograms (mcg) of folic acid.  If you develop constipation, try taking a stool softener if your health care provider approves. Eating and drinking   Eat a balanced diet that includes fresh fruits and vegetables, whole grains, good sources of protein such as meat, eggs, or tofu,  and low-fat dairy. Your health care provider will help you determine the amount of weight gain that is right for you.  Avoid raw meat and uncooked cheese. These carry germs that can cause birth defects in the baby.  If you have low calcium intake from food, talk to your health care provider about whether you should take a daily calcium supplement.  Eat four or five small meals rather than three large meals a day.  Limit foods that are high in fat and processed sugars, such as fried and sweet foods.  To prevent constipation: ? Drink enough fluid to keep your urine clear or pale yellow. ? Eat foods that are high in fiber, such as fresh fruits and vegetables, whole grains, and beans. Activity  Exercise only as directed by your health care provider. Most women can continue their usual exercise routine during pregnancy. Try to exercise for 30 minutes at least 5 days a week. Stop exercising if you experience uterine contractions.  Avoid heavy lifting.  Do   not exercise in extreme heat or humidity, or at high altitudes.  Wear low-heel, comfortable shoes.  Practice good posture.  You may continue to have sex unless your health care provider tells you otherwise. Relieving pain and discomfort  Take frequent breaks and rest with your legs elevated if you have leg cramps or low back pain.  Take warm sitz baths to soothe any pain or discomfort caused by hemorrhoids. Use hemorrhoid cream if your health care provider approves.  Wear a good support bra to prevent discomfort from breast tenderness.  If you develop varicose veins: ? Wear support pantyhose or compression stockings as told by your healthcare provider. ? Elevate your feet for 15 minutes, 3-4 times a day. Prenatal care  Write down your questions. Take them to your prenatal visits.  Keep all your prenatal visits as told by your health care provider. This is important. Safety  Wear your seat belt at all times when driving.  Make  a list of emergency phone numbers, including numbers for family, friends, the hospital, and police and fire departments. General instructions  Avoid cat litter boxes and soil used by cats. These carry germs that can cause birth defects in the baby. If you have a cat, ask someone to clean the litter box for you.  Do not travel far distances unless it is absolutely necessary and only with the approval of your health care provider.  Do not use hot tubs, steam rooms, or saunas.  Do not drink alcohol.  Do not use any products that contain nicotine or tobacco, such as cigarettes and e-cigarettes. If you need help quitting, ask your health care provider.  Do not use any medicinal herbs or unprescribed drugs. These chemicals affect the formation and growth of the baby.  Do not douche or use tampons or scented sanitary pads.  Do not cross your legs for long periods of time.  To prepare for the arrival of your baby: ? Take prenatal classes to understand, practice, and ask questions about labor and delivery. ? Make a trial run to the hospital. ? Visit the hospital and tour the maternity area. ? Arrange for maternity or paternity leave through employers. ? Arrange for family and friends to take care of pets while you are in the hospital. ? Purchase a rear-facing car seat and make sure you know how to install it in your car. ? Pack your hospital bag. ? Prepare the baby's nursery. Make sure to remove all pillows and stuffed animals from the baby's crib to prevent suffocation.  Visit your dentist if you have not gone during your pregnancy. Use a soft toothbrush to brush your teeth and be gentle when you floss. Contact a health care provider if:  You are unsure if you are in labor or if your water has broken.  You become dizzy.  You have mild pelvic cramps, pelvic pressure, or nagging pain in your abdominal area.  You have lower back pain.  You have persistent nausea, vomiting, or  diarrhea.  You have an unusual or bad smelling vaginal discharge.  You have pain when you urinate. Get help right away if:  Your water breaks before 37 weeks.  You have regular contractions less than 5 minutes apart before 37 weeks.  You have a fever.  You are leaking fluid from your vagina.  You have spotting or bleeding from your vagina.  You have severe abdominal pain or cramping.  You have rapid weight loss or weight gain.  You have   shortness of breath with chest pain.  You notice sudden or extreme swelling of your face, hands, ankles, feet, or legs.  Your baby makes fewer than 10 movements in 2 hours.  You have severe headaches that do not go away when you take medicine.  You have vision changes. Summary  The third trimester is from week 28 through week 40, months 7 through 9. The third trimester is a time when the unborn baby (fetus) is growing rapidly.  During the third trimester, your discomfort may increase as you and your baby continue to gain weight. You may have abdominal, leg, and back pain, sleeping problems, and an increased need to urinate.  During the third trimester your breasts will keep growing and they will continue to become tender. A yellow fluid (colostrum) may leak from your breasts. This is the first milk you are producing for your baby.  False labor is a condition in which you feel small, irregular tightenings of the muscles in the womb (contractions) that eventually go away. These are called Braxton Hicks contractions. Contractions may last for hours, days, or even weeks before true labor sets in.  Signs of labor can include: abdominal cramps; regular contractions that start at 10 minutes apart and become stronger and more frequent with time; watery or bloody mucus discharge that comes from the vagina; increased pelvic pressure and dull back pain; and leaking of amniotic fluid. This information is not intended to replace advice given to you by your  health care provider. Make sure you discuss any questions you have with your health care provider. Document Released: 08/19/2001 Document Revised: 09/30/2016 Document Reviewed: 09/30/2016 Elsevier Interactive Patient Education  2019 [employer]  

## 2018-11-12 NOTE — Progress Notes (Signed)
   PRENATAL VISIT NOTE  Subjective:  Mia Mccoy is a 23 y.o. G1P0 at [redacted]w[redacted]d being seen today for ongoing prenatal care.  She is currently monitored for the following issues for this low-risk pregnancy and has Immunization due; Family planning; Marijuana use; and Supervision of normal first pregnancy, antepartum on their problem list.  Patient reports cramping 1-2x/day.  Contractions: Not present. Vag. Bleeding: None.  Movement: Present. Denies leaking of fluid.   The following portions of the patient's history were reviewed and updated as appropriate: allergies, current medications, past family history, past medical history, past social history, past surgical history and problem list. Problem list updated.  Objective:   Vitals:   11/12/18 0815  BP: 111/73  Pulse: 94  Weight: 188 lb (85.3 kg)    Fetal Status: Fetal Heart Rate (bpm): 154   Movement: Present     General:  Alert, oriented and cooperative. Patient is in no acute distress.  Skin: Skin is warm and dry. No rash noted.   Cardiovascular: Normal heart rate noted  Respiratory: Normal respiratory effort, no problems with respiration noted  Abdomen: Soft, gravid, appropriate for gestational age.  Pain/Pressure: Absent     Pelvic: Cervical exam deferred        Extremities: Normal range of motion.  Edema: None  Mental Status: Normal mood and affect. Normal behavior. Normal judgment and thought content.   Assessment and Plan:  Pregnancy: G1P0 at [redacted]w[redacted]d  1. Supervision of normal first pregnancy, antepartum - Reviewed follow-up ultrasound for incomplete anatomy > normal views - Discussed 2 hr lab for next visit and Tdap   2. Obesity in Pregnancy - Reviewed eating strategies after 24 hr diet recall > decrease carbohydrates (donuts, extra bread, sugary cereals)  Preterm labor symptoms and general obstetric precautions including but not limited to vaginal bleeding, contractions, leaking of fluid and fetal movement were  reviewed in detail with the patient. Please refer to After Visit Summary for other counseling recommendations.  Return in 2 weeks (on 11/26/2018).  Future Appointments  Date Time Provider Department Center  11/26/2018  8:30 AM [facility] None  11/26/2018  9:30 AM Amedeo Gory, CNM [facility] None    Amedeo Gory, CNM

## 2018-11-23 ENCOUNTER — Telehealth: Payer: Self-pay | Admitting: General Practice

## 2018-11-23 NOTE — Telephone Encounter (Signed)
Left message on VM in regards to limiting only 1-support person to office visit d/t COVID-19.  Asked patient to give our office a call with any questions or concerns.  MyChart message sent to patient as well.

## 2018-11-26 ENCOUNTER — Ambulatory Visit (INDEPENDENT_AMBULATORY_CARE_PROVIDER_SITE_OTHER): Payer: Medicaid Other | Admitting: Family

## 2018-11-26 ENCOUNTER — Other Ambulatory Visit: Payer: Self-pay

## 2018-11-26 ENCOUNTER — Other Ambulatory Visit: Payer: Medicaid Other | Admitting: *Deleted

## 2018-11-26 VITALS — BP 116/63 | HR 89 | Temp 98.6°F | Wt 193.8 lb

## 2018-11-26 DIAGNOSIS — Z3A28 28 weeks gestation of pregnancy: Secondary | ICD-10-CM

## 2018-11-26 DIAGNOSIS — Z3400 Encounter for supervision of normal first pregnancy, unspecified trimester: Secondary | ICD-10-CM | POA: Diagnosis not present

## 2018-11-26 DIAGNOSIS — Z3403 Encounter for supervision of normal first pregnancy, third trimester: Secondary | ICD-10-CM | POA: Diagnosis not present

## 2018-11-26 DIAGNOSIS — Z23 Encounter for immunization: Secondary | ICD-10-CM | POA: Diagnosis not present

## 2018-11-26 DIAGNOSIS — O99013 Anemia complicating pregnancy, third trimester: Secondary | ICD-10-CM

## 2018-11-26 NOTE — Progress Notes (Signed)
   PRENATAL VISIT NOTE  Subjective:  Mia Mccoy is a 23 y.o. G1P0 at [redacted]w[redacted]d being seen today for ongoing prenatal care.  She is currently monitored for the following issues for this low-risk pregnancy and has Immunization due; Family planning; Marijuana use; and Supervision of normal first pregnancy, antepartum on their problem list.  Patient reports no complaints.  Reports decreasing intake of sugary items to help with weight gain.   Contractions: Not present. Vag. Bleeding: None.  Movement: Present. Denies leaking of fluid.   The following portions of the patient's history were reviewed and updated as appropriate: allergies, current medications, past family history, past medical history, past social history, past surgical history and problem list.   Objective:   Vitals:   11/26/18 0822  BP: 116/63  Pulse: 89  Temp: 98.6 F (37 C)  Weight: 193 lb 12.8 oz (87.9 kg)    Fetal Status:     Movement: Present     General:  Alert, oriented and cooperative. Patient is in no acute distress.  Skin: Skin is warm and dry. No rash noted.   Cardiovascular: Normal heart rate noted  Respiratory: Normal respiratory effort, no problems with respiration noted  Abdomen: Soft, gravid, appropriate for gestational age.  Pain/Pressure: Absent     Pelvic: Cervical exam deferred        Extremities: Normal range of motion.  Edema: None  Mental Status: Normal mood and affect. Normal behavior. Normal judgment and thought content.   Assessment and Plan:  Pregnancy: G1P0 at [redacted]w[redacted]d 1. Supervision of normal first pregnancy, antepartum - Glucose Tolerance, 2 Hours w/1 Hour - HIV Antibody (routine testing w rflx) - RPR - CBC - Tdap vaccine greater than or equal to 7yo IM - Patient consents to Telehealth visits and will provide own cuff  Preterm labor symptoms and general obstetric precautions including but not limited to vaginal bleeding, contractions, leaking of fluid and fetal movement were reviewed in  detail with the patient. Please refer to After Visit Summary for other counseling recommendations.   Return in about 2 weeks (around 12/10/2018) for Telehealth Visit - Normal OB.  Future Appointments  Date Time Provider Department Center  11/26/2018  9:30 AM Amedeo Gory, CNM [facility] None    Amedeo Gory, [place]

## 2018-11-26 NOTE — Progress Notes (Signed)
   Patient in clinic for 28 week lab work.  Martin, Tamika L, RN  

## 2018-11-27 LAB — RPR: RPR Ser Ql: NONREACTIVE

## 2018-11-27 LAB — CBC
Hematocrit: 32.4 % — ABNORMAL LOW (ref 34.0–46.6)
Hemoglobin: 10.6 g/dL — ABNORMAL LOW (ref 11.1–15.9)
MCH: 25.5 pg — ABNORMAL LOW (ref 26.6–33.0)
MCHC: 32.7 g/dL (ref 31.5–35.7)
MCV: 78 fL — ABNORMAL LOW (ref 79–97)
Platelets: 230 10*3/uL (ref 150–450)
RBC: 4.16 x10E6/uL (ref 3.77–5.28)
RDW: 12.7 % (ref 11.7–15.4)
WBC: 11.3 10*3/uL — ABNORMAL HIGH (ref 3.4–10.8)

## 2018-11-27 LAB — GLUCOSE TOLERANCE, 2 HOURS W/ 1HR
GLUCOSE, 2 HOUR: 109 mg/dL (ref 65–152)
Glucose, 1 hour: 134 mg/dL (ref 65–179)
Glucose, Fasting: 87 mg/dL (ref 65–91)

## 2018-11-27 LAB — HIV ANTIBODY (ROUTINE TESTING W REFLEX): HIV SCREEN 4TH GENERATION: NONREACTIVE

## 2018-11-29 ENCOUNTER — Telehealth: Payer: Self-pay | Admitting: *Deleted

## 2018-11-29 MED ORDER — FERROUS SULFATE 325 (65 FE) MG PO TABS: 325.0000 mg | ORAL_TABLET | Freq: Two times a day (BID) | ORAL | 1 refills | Status: DC

## 2018-11-29 NOTE — Addendum Note (Signed)
Addended by: Amedeo Gory on: 11/29/2018 12:26 PM   Modules accepted: Orders

## 2018-11-29 NOTE — Telephone Encounter (Signed)
-----   Message from Amedeo Gory, [place] sent at 11/29/2018 12:29 PM EDT ----- Pt sent MyChart message regarding results and need to pick up RX for anemia at pharmacy.

## 2018-11-30 ENCOUNTER — Encounter: Payer: Self-pay | Admitting: General Practice

## 2018-12-10 ENCOUNTER — Other Ambulatory Visit: Payer: Self-pay

## 2018-12-10 ENCOUNTER — Ambulatory Visit (INDEPENDENT_AMBULATORY_CARE_PROVIDER_SITE_OTHER): Payer: Medicaid Other | Admitting: Family

## 2018-12-10 VITALS — BP 116/76 | HR 98 | Temp 98.5°F | Wt 194.0 lb

## 2018-12-10 DIAGNOSIS — O99019 Anemia complicating pregnancy, unspecified trimester: Secondary | ICD-10-CM | POA: Insufficient documentation

## 2018-12-10 DIAGNOSIS — Z3400 Encounter for supervision of normal first pregnancy, unspecified trimester: Secondary | ICD-10-CM

## 2018-12-10 DIAGNOSIS — Z3A30 30 weeks gestation of pregnancy: Secondary | ICD-10-CM

## 2018-12-10 DIAGNOSIS — O99013 Anemia complicating pregnancy, third trimester: Secondary | ICD-10-CM

## 2018-12-10 NOTE — Progress Notes (Signed)
   PRENATAL VISIT NOTE  Subjective:  Mia Mccoy is a 23 y.o. G1P0 at [redacted]w[redacted]d being seen today for ongoing prenatal care.  She is currently monitored for the following issues for this low-risk pregnancy and has Immunization due; Family planning; Marijuana use; and Supervision of normal first pregnancy, antepartum on their problem list.  Patient reports slight cramping, no bleeding.  Contractions: Irritability. Vag. Bleeding: None.  Movement: Present. Denies leaking of fluid.   The following portions of the patient's history were reviewed and updated as appropriate: allergies, current medications, past family history, past medical history, past social history, past surgical history and problem list.   Objective:  There were no vitals filed for this visit.  Fetal Status:     Movement: Present     General:  Alert, oriented and cooperative. Patient is in no acute distress.  Skin: Skin is warm and dry. No rash noted.   Cardiovascular: Normal heart rate noted  Respiratory: Normal respiratory effort, no problems with respiration noted  Abdomen: Soft, gravid, appropriate for gestational age.  Pain/Pressure: Absent     Pelvic: Cervical exam deferred        Extremities: Normal range of motion.  Edema: None  Mental Status: Normal mood and affect. Normal behavior. Normal judgment and thought content.   Assessment and Plan:  Pregnancy: G1P0 at [redacted]w[redacted]d 1. Supervision of normal first pregnancy, antepartum - Reviewed third trimester lab results - Discussed telehealth/virtual visits platform - Downloaded BabyScripts and WebEx; plans to pick up automatic cuff - Reviewed strategies to reduce transmission (social distance, gloves while at [employer] for work, frequent hand washing)  2.  Anemia in Pregnancy - Taking Ferrous Sulfate; encourage - Increase fiber  Preterm labor symptoms and general obstetric precautions including but not limited to vaginal bleeding, contractions, leaking of fluid and  fetal movement were reviewed in detail with the patient. Please refer to After Visit Summary for other counseling recommendations.   Follow-up in two weeks   Amedeo Gory, CNM

## 2019-01-12 ENCOUNTER — Encounter: Payer: Medicaid Other | Admitting: Obstetrics and Gynecology

## 2019-01-13 ENCOUNTER — Other Ambulatory Visit: Payer: Self-pay

## 2019-01-13 ENCOUNTER — Encounter: Payer: Self-pay | Admitting: Obstetrics and Gynecology

## 2019-01-13 ENCOUNTER — Other Ambulatory Visit (HOSPITAL_COMMUNITY)
Admission: RE | Admit: 2019-01-13 | Discharge: 2019-01-13 | Disposition: A | Discharge status: Home / Self Care | Payer: Medicaid Other | Source: Ambulatory Visit | Attending: Family | Admitting: Family

## 2019-01-13 ENCOUNTER — Ambulatory Visit (INDEPENDENT_AMBULATORY_CARE_PROVIDER_SITE_OTHER): Payer: Medicaid Other | Admitting: Obstetrics and Gynecology

## 2019-01-13 VITALS — BP 109/73 | HR 91 | Temp 98.6°F | Wt 203.6 lb

## 2019-01-13 DIAGNOSIS — Z3400 Encounter for supervision of normal first pregnancy, unspecified trimester: Secondary | ICD-10-CM | POA: Insufficient documentation

## 2019-01-13 DIAGNOSIS — O26843 Uterine size-date discrepancy, third trimester: Secondary | ICD-10-CM | POA: Insufficient documentation

## 2019-01-13 DIAGNOSIS — O2603 Excessive weight gain in pregnancy, third trimester: Secondary | ICD-10-CM | POA: Insufficient documentation

## 2019-01-13 DIAGNOSIS — Z3A35 35 weeks gestation of pregnancy: Secondary | ICD-10-CM

## 2019-01-13 NOTE — Progress Notes (Signed)
   PRENATAL VISIT NOTE  Subjective:  Mia Mccoy is a 23 y.o. G1P0 at [redacted]w[redacted]d being seen today for ongoing prenatal care.  She is currently monitored for the following issues for this low-risk pregnancy and has Immunization due; Family planning; Marijuana use; Supervision of normal first pregnancy, antepartum; Anemia affecting pregnancy; Uterine size date discrepancy pregnancy, third trimester; and Excessive weight gain during pregnancy in third trimester on their problem list.  Patient reports occasional contractions.  Contractions: Not present. Vag. Bleeding: None.  Movement: Present. Denies leaking of fluid.   The following portions of the patient's history were reviewed and updated as appropriate: allergies, current medications, past family history, past medical history, past social history, past surgical history and problem list.   Objective:   Vitals:   01/13/19 1328  BP: 109/73  Pulse: 91  Temp: 98.6 F (37 C)  Weight: 203 lb 9.6 oz (92.4 kg)    Fetal Status: Fetal Heart Rate (bpm): 143 Fundal Height: 40 cm Movement: Present  Presentation: Vertex  General:  Alert, oriented and cooperative. Patient is in no acute distress.  Skin: Skin is warm and dry. No rash noted.   Cardiovascular: Normal heart rate noted  Respiratory: Normal respiratory effort, no problems with respiration noted  Abdomen: Soft, gravid, appropriate for gestational age.  Pain/Pressure: Present     Pelvic: Cervical exam performed Dilation: Closed Effacement (%): Thick Station: Ballotable  Extremities: Normal range of motion.  Edema: None  Mental Status: Normal mood and affect. Normal behavior. Normal judgment and thought content.   Assessment and Plan:  Pregnancy: G1P0 at [redacted]w[redacted]d 1. Supervision of normal first pregnancy, antepartum - Culture, beta strep (group b only) - Cervicovaginal ancillary only( [place]) - Advised that nv would be on Webex  2. Uterine size date discrepancy pregnancy, third  trimester - [place] MFM OB COMP + 14 WK; Future  Preterm labor symptoms and general obstetric precautions including but not limited to vaginal bleeding, contractions, leaking of fluid and fetal movement were reviewed in detail with the patient. Please refer to After Visit Summary for other counseling recommendations.   Return in about 2 weeks (around 01/27/2019) for Return OB - webex/telehealth.  Future Appointments  Date Time Provider Department Center  01/17/2019  2:15 PM [facility] [place] 4 [facility]    Raelyn Mora, CNM

## 2019-01-14 ENCOUNTER — Encounter: Payer: Medicaid Other | Admitting: Family

## 2019-01-14 LAB — CERVICOVAGINAL ANCILLARY ONLY
Bacterial vaginitis: NEGATIVE
Candida vaginitis: NEGATIVE
Chlamydia: NEGATIVE
Neisseria Gonorrhea: NEGATIVE
Trichomonas: NEGATIVE

## 2019-01-16 LAB — CULTURE, BETA STREP (GROUP B ONLY): Strep Gp B Culture: POSITIVE — AB

## 2019-01-17 ENCOUNTER — Ambulatory Visit (HOSPITAL_COMMUNITY)
Admission: RE | Admit: 2019-01-17 | Discharge: 2019-01-17 | Disposition: A | Discharge status: Home / Self Care | Payer: Medicaid Other | Source: Ambulatory Visit | Attending: Obstetrics and Gynecology | Admitting: Obstetrics and Gynecology

## 2019-01-17 ENCOUNTER — Ambulatory Visit (HOSPITAL_COMMUNITY): Payer: Medicaid Other | Admitting: *Deleted

## 2019-01-17 ENCOUNTER — Other Ambulatory Visit: Payer: Self-pay

## 2019-01-17 ENCOUNTER — Encounter (HOSPITAL_COMMUNITY): Payer: Self-pay | Admitting: *Deleted

## 2019-01-17 DIAGNOSIS — Z3A36 36 weeks gestation of pregnancy: Secondary | ICD-10-CM

## 2019-01-17 DIAGNOSIS — O26843 Uterine size-date discrepancy, third trimester: Secondary | ICD-10-CM

## 2019-01-17 DIAGNOSIS — Z3400 Encounter for supervision of normal first pregnancy, unspecified trimester: Secondary | ICD-10-CM | POA: Diagnosis not present

## 2019-01-17 DIAGNOSIS — O2603 Excessive weight gain in pregnancy, third trimester: Secondary | ICD-10-CM | POA: Insufficient documentation

## 2019-01-17 DIAGNOSIS — O99013 Anemia complicating pregnancy, third trimester: Secondary | ICD-10-CM

## 2019-01-17 DIAGNOSIS — Z362 Encounter for other antenatal screening follow-up: Secondary | ICD-10-CM

## 2019-01-27 ENCOUNTER — Encounter: Payer: Medicaid Other | Admitting: Obstetrics and Gynecology

## 2019-01-27 ENCOUNTER — Telehealth (INDEPENDENT_AMBULATORY_CARE_PROVIDER_SITE_OTHER): Payer: Medicaid Other | Admitting: Obstetrics and Gynecology

## 2019-01-27 ENCOUNTER — Encounter: Payer: Self-pay | Admitting: Obstetrics and Gynecology

## 2019-01-27 DIAGNOSIS — O26843 Uterine size-date discrepancy, third trimester: Secondary | ICD-10-CM

## 2019-01-27 DIAGNOSIS — Z3400 Encounter for supervision of normal first pregnancy, unspecified trimester: Secondary | ICD-10-CM

## 2019-01-27 DIAGNOSIS — O2603 Excessive weight gain in pregnancy, third trimester: Secondary | ICD-10-CM

## 2019-01-27 DIAGNOSIS — Z3A37 37 weeks gestation of pregnancy: Secondary | ICD-10-CM

## 2019-01-27 NOTE — Progress Notes (Signed)
   WEBEX VIRTUAL OBSTETRICS VISIT ENCOUNTER NOTE  I connected with Mia Mccoy on 01/27/19 at  3:30 PM EDT by Webex at home and verified that I am speaking with the correct person using two identifiers.   I discussed the limitations, risks, security and privacy concerns of performing an evaluation and management service by Webex and the availability of in person appointments. I also discussed with the patient that there may be a patient responsible charge related to this service. The patient expressed understanding and agreed to proceed.  Subjective:  Mia Mccoy is a 23 y.o. G1P0 at [redacted]w[redacted]d being followed for ongoing prenatal care.  She is currently monitored for the following issues for this low-risk pregnancy and has Immunization due; Family planning; Marijuana use; Supervision of normal first pregnancy, antepartum; Anemia affecting pregnancy; Uterine size date discrepancy pregnancy, third trimester; and Excessive weight gain during pregnancy in third trimester on their problem list.  Patient reports occasional contractions. Reports fetal movement. Denies any contractions, bleeding or leaking of fluid.   The following portions of the patient's history were reviewed and updated as appropriate: allergies, current medications, past family history, past medical history, past social history, past surgical history and problem list.  Vitals:   01/27/19 1556  BP: 107/83  Pulse: (!) 106  Done by patient using home BP cuff  Objective:   General:  Alert, oriented and cooperative.   Mental Status: Normal mood and affect perceived. Normal judgment and thought content.  Rest of physical exam deferred due to type of encounter  Assessment and Plan:  Pregnancy: G1P0 at [redacted]w[redacted]d 1. Uterine size date discrepancy pregnancy, third trimester - Normal interval growth on U/S 01/18/19 - EFW 3115 gm (81%)  2. Supervision of normal first pregnancy, antepartum - Advised of (+) GBS and the need for abx in  labor  Term labor symptoms and general obstetric precautions including but not limited to vaginal bleeding, contractions, leaking of fluid and fetal movement were reviewed in detail with the patient.  I discussed the assessment and treatment plan with the patient. The patient was provided an opportunity to ask questions and all were answered. The patient agreed with the plan and demonstrated an understanding of the instructions. The patient was advised to call back or seek an in-person office evaluation/go to MAU at [facility] for any urgent or concerning symptoms. Please refer to After Visit Summary for other counseling recommendations.   I provided 5 minutes of non-face-to-face time during this encounter. There was 10 minutes of chart review time spent prior to this encounter. Total time spent = 15 minutes.   Return in about 1 week (around 02/03/2019) for Return OB visit.  Future Appointments  Date Time Provider Department Center  02/02/2019  1:10 PM Sharyon Cable, CNM [facility] None    Raelyn Mora, CNM Center for [employer], [facility]

## 2019-02-02 ENCOUNTER — Encounter: Payer: Self-pay | Admitting: Certified Nurse Midwife

## 2019-02-02 ENCOUNTER — Other Ambulatory Visit: Payer: Self-pay

## 2019-02-02 ENCOUNTER — Ambulatory Visit (INDEPENDENT_AMBULATORY_CARE_PROVIDER_SITE_OTHER): Payer: Medicaid Other | Admitting: Certified Nurse Midwife

## 2019-02-02 VITALS — BP 122/78 | HR 80 | Temp 98.6°F | Wt 208.2 lb

## 2019-02-02 DIAGNOSIS — O26843 Uterine size-date discrepancy, third trimester: Secondary | ICD-10-CM

## 2019-02-02 DIAGNOSIS — Z3400 Encounter for supervision of normal first pregnancy, unspecified trimester: Secondary | ICD-10-CM

## 2019-02-02 DIAGNOSIS — O99013 Anemia complicating pregnancy, third trimester: Secondary | ICD-10-CM

## 2019-02-02 DIAGNOSIS — Z3A38 38 weeks gestation of pregnancy: Secondary | ICD-10-CM

## 2019-02-02 NOTE — Progress Notes (Signed)
   PRENATAL VISIT NOTE  Subjective:  Mia Mccoy is a 23 y.o. G1P0 at [redacted]w[redacted]d being seen today for ongoing prenatal care.  She is currently monitored for the following issues for this low-risk pregnancy and has Immunization due; Family planning; Marijuana use; Supervision of normal first pregnancy, antepartum; Anemia affecting pregnancy; Uterine size date discrepancy pregnancy, third trimester; and Excessive weight gain during pregnancy in third trimester on their problem list.  Patient reports occasional contractions.  Contractions: Irregular. Vag. Bleeding: None.  Movement: Present. Denies leaking of fluid.   The following portions of the patient's history were reviewed and updated as appropriate: allergies, current medications, past family history, past medical history, past social history, past surgical history and problem list.   Objective:   Vitals:   02/02/19 1312  BP: 122/78  Pulse: 80  Temp: 98.6 F (37 C)  Weight: 208 lb 3.2 oz (94.4 kg)    Fetal Status: Fetal Heart Rate (bpm): 154 Fundal Height: 44 cm Movement: Present  Presentation: Vertex  General:  Alert, oriented and cooperative. Patient is in no acute distress.  Skin: Skin is warm and dry. No rash noted.   Cardiovascular: Normal heart rate noted  Respiratory: Normal respiratory effort, no problems with respiration noted  Abdomen: Soft, gravid, appropriate for gestational age.  Pain/Pressure: Present     Pelvic: Cervical exam performed Dilation: 1 Effacement (%): 50 Station: -3  Extremities: Normal range of motion.  Edema: Trace  Mental Status: Normal mood and affect. Normal behavior. Normal judgment and thought content.   Assessment and Plan:  Pregnancy: G1P0 at [redacted]w[redacted]d 1. Supervision of normal first pregnancy, antepartum - Patient doing well, occasional braxton hicks contractions  - Anticipatory guidance on upcoming appointments  - Educated on membrane sweeping after 39 weeks, discussed use of EPO and RRT to  increase uterine contractions and continuing IC to release natural oxytocin.  - Routine prenatal care   2. Uterine size date discrepancy pregnancy, third trimester - EFW by [place] on 5/12- 3115gm  - Watch FH and Leopalds closely, ~ 7.5-8lbs by Leopalds today   3. Anemia affecting pregnancy in third trimester   Term labor symptoms and general obstetric precautions including but not limited to vaginal bleeding, contractions, leaking of fluid and fetal movement were reviewed in detail with the patient. Please refer to After Visit Summary for other counseling recommendations.   Return in about 1 week (around 02/09/2019) for ROB-membrane sweep .  Future Appointments  Date Time Provider Department Center  02/09/2019  4:10 PM Raelyn Mora, CNM [facility] None    Sharyon Cable, [place]

## 2019-02-06 ENCOUNTER — Encounter (HOSPITAL_COMMUNITY): Payer: Self-pay

## 2019-02-06 ENCOUNTER — Inpatient Hospital Stay (HOSPITAL_COMMUNITY)
Admission: EM | Admit: 2019-02-06 | Discharge: 2019-02-09 | DRG: 787 | Disposition: A | Discharge status: Home / Self Care | Payer: Medicaid Other | Source: Ambulatory Visit | Attending: Family Medicine | Admitting: Family Medicine

## 2019-02-06 ENCOUNTER — Inpatient Hospital Stay (HOSPITAL_COMMUNITY): Payer: Medicaid Other | Admitting: Anesthesiology

## 2019-02-06 ENCOUNTER — Other Ambulatory Visit: Payer: Self-pay

## 2019-02-06 DIAGNOSIS — O99214 Obesity complicating childbirth: Secondary | ICD-10-CM | POA: Diagnosis not present

## 2019-02-06 DIAGNOSIS — Z1159 Encounter for screening for other viral diseases: Secondary | ICD-10-CM

## 2019-02-06 DIAGNOSIS — Z3689 Encounter for other specified antenatal screening: Secondary | ICD-10-CM

## 2019-02-06 DIAGNOSIS — O3663X0 Maternal care for excessive fetal growth, third trimester, not applicable or unspecified: Secondary | ICD-10-CM | POA: Diagnosis not present

## 2019-02-06 DIAGNOSIS — Z30430 Encounter for insertion of intrauterine contraceptive device: Secondary | ICD-10-CM

## 2019-02-06 DIAGNOSIS — D62 Acute posthemorrhagic anemia: Secondary | ICD-10-CM | POA: Diagnosis not present

## 2019-02-06 DIAGNOSIS — O9081 Anemia of the puerperium: Secondary | ICD-10-CM | POA: Diagnosis not present

## 2019-02-06 DIAGNOSIS — O4202 Full-term premature rupture of membranes, onset of labor within 24 hours of rupture: Secondary | ICD-10-CM | POA: Diagnosis not present

## 2019-02-06 DIAGNOSIS — O99820 Streptococcus B carrier state complicating pregnancy: Secondary | ICD-10-CM

## 2019-02-06 DIAGNOSIS — Z3400 Encounter for supervision of normal first pregnancy, unspecified trimester: Secondary | ICD-10-CM

## 2019-02-06 DIAGNOSIS — O26843 Uterine size-date discrepancy, third trimester: Secondary | ICD-10-CM

## 2019-02-06 DIAGNOSIS — O43813 Placental infarction, third trimester: Secondary | ICD-10-CM | POA: Diagnosis not present

## 2019-02-06 DIAGNOSIS — O99824 Streptococcus B carrier state complicating childbirth: Secondary | ICD-10-CM | POA: Diagnosis present

## 2019-02-06 DIAGNOSIS — O99013 Anemia complicating pregnancy, third trimester: Secondary | ICD-10-CM

## 2019-02-06 DIAGNOSIS — Z98891 History of uterine scar from previous surgery: Secondary | ICD-10-CM

## 2019-02-06 DIAGNOSIS — O2603 Excessive weight gain in pregnancy, third trimester: Secondary | ICD-10-CM

## 2019-02-06 DIAGNOSIS — Z3A39 39 weeks gestation of pregnancy: Secondary | ICD-10-CM | POA: Diagnosis not present

## 2019-02-06 DIAGNOSIS — Z3A40 40 weeks gestation of pregnancy: Secondary | ICD-10-CM | POA: Diagnosis not present

## 2019-02-06 LAB — PROTEIN / CREATININE RATIO, URINE
Creatinine, Urine: 88.05 mg/dL
Protein Creatinine Ratio: 1.23 mg/mg{Cre} — ABNORMAL HIGH (ref 0.00–0.15)
Total Protein, Urine: 108 mg/dL

## 2019-02-06 LAB — CBC
HCT: 38.7 % (ref 36.0–46.0)
Hemoglobin: 12.4 g/dL (ref 12.0–15.0)
MCH: 24.8 pg — ABNORMAL LOW (ref 26.0–34.0)
MCHC: 32 g/dL (ref 30.0–36.0)
MCV: 77.6 fL — ABNORMAL LOW (ref 80.0–100.0)
Platelets: 209 10*3/uL (ref 150–400)
RBC: 4.99 MIL/uL (ref 3.87–5.11)
RDW: 15.6 % — ABNORMAL HIGH (ref 11.5–15.5)
WBC: 10.8 10*3/uL — ABNORMAL HIGH (ref 4.0–10.5)
nRBC: 0 % (ref 0.0–0.2)

## 2019-02-06 LAB — TYPE AND SCREEN
ABO/RH(D): O POS
Antibody Screen: NEGATIVE

## 2019-02-06 LAB — COMPREHENSIVE METABOLIC PANEL
ALT: 15 U/L (ref 0–44)
AST: 18 U/L (ref 15–41)
Albumin: 2.7 g/dL — ABNORMAL LOW (ref 3.5–5.0)
Alkaline Phosphatase: 204 U/L — ABNORMAL HIGH (ref 38–126)
Anion gap: 9 (ref 5–15)
BUN: 9 mg/dL (ref 6–20)
CO2: 22 mmol/L (ref 22–32)
Calcium: 9.2 mg/dL (ref 8.9–10.3)
Chloride: 105 mmol/L (ref 98–111)
Creatinine, Ser: 0.6 mg/dL (ref 0.44–1.00)
GFR calc Af Amer: >60 mL/min (ref >60)
GFR calc non Af Amer: >60 mL/min (ref >60)
Glucose, Bld: 109 mg/dL — ABNORMAL HIGH (ref 70–99)
Potassium: 3.8 mmol/L (ref 3.5–5.1)
Sodium: 136 mmol/L (ref 135–145)
Total Bilirubin: 0.3 mg/dL (ref 0.3–1.2)
Total Protein: 5.9 g/dL — ABNORMAL LOW (ref 6.5–8.1)

## 2019-02-06 LAB — RPR: RPR Ser Ql: NONREACTIVE

## 2019-02-06 LAB — POCT FERN TEST: POCT Fern Test: POSITIVE

## 2019-02-06 LAB — SARS CORONAVIRUS 2 BY RT PCR (HOSPITAL ORDER, PERFORMED IN ~~LOC~~ HOSPITAL LAB): SARS Coronavirus 2: NEGATIVE

## 2019-02-06 MED ORDER — PHENYLEPHRINE 40 MCG/ML (10ML) SYRINGE FOR IV PUSH (FOR BLOOD PRESSURE SUPPORT): 80.0000 ug | PREFILLED_SYRINGE | INTRAVENOUS | Status: DC | PRN

## 2019-02-06 MED ORDER — LIDOCAINE HCL (PF) 1 % IJ SOLN
INTRAMUSCULAR | Status: DC | PRN
  Administered 2019-02-06 (×2): 5 mL via EPIDURAL

## 2019-02-06 MED ORDER — ONDANSETRON HCL 4 MG/2ML IJ SOLN: 4.0000 mg | Freq: Four times a day (QID) | INTRAMUSCULAR | Status: DC | PRN

## 2019-02-06 MED ORDER — LACTATED RINGERS IV SOLN: 500.0000 mL | INTRAVENOUS | Status: DC | PRN

## 2019-02-06 MED ORDER — TERBUTALINE SULFATE 1 MG/ML IJ SOLN: 0.2500 mg | Freq: Once | INTRAMUSCULAR | Status: DC | PRN

## 2019-02-06 MED ORDER — ACETAMINOPHEN 325 MG PO TABS
650.0000 mg | ORAL_TABLET | ORAL | Status: DC | PRN
  Administered 2019-02-06: 21:00:00 650 mg via ORAL
  Filled 2019-02-06: qty 2

## 2019-02-06 MED ORDER — FUROSEMIDE 10 MG/ML IJ SOLN
20.0000 mg | Freq: Once | INTRAMUSCULAR | Status: AC
  Administered 2019-02-06: 22:00:00 20 mg via INTRAVENOUS
  Filled 2019-02-06: qty 2

## 2019-02-06 MED ORDER — FENTANYL-BUPIVACAINE-NACL 0.5-0.125-0.9 MG/250ML-% EP SOLN
12.0000 mL/h | EPIDURAL | Status: DC | PRN
  Filled 2019-02-06: qty 250

## 2019-02-06 MED ORDER — LACTATED RINGERS IV SOLN: 500.0000 mL | Freq: Once | INTRAVENOUS | Status: DC

## 2019-02-06 MED ORDER — DIPHENHYDRAMINE HCL 50 MG/ML IJ SOLN: 12.5000 mg | INTRAMUSCULAR | Status: DC | PRN

## 2019-02-06 MED ORDER — PENICILLIN G 3 MILLION UNITS IVPB - SIMPLE MED
3.0000 10*6.[IU] | INTRAVENOUS | Status: DC
  Administered 2019-02-06 (×5): 3 10*6.[IU] via INTRAVENOUS
  Filled 2019-02-06 (×5): qty 100

## 2019-02-06 MED ORDER — OXYTOCIN BOLUS FROM INFUSION: 500.0000 mL | Freq: Once | INTRAVENOUS | Status: DC

## 2019-02-06 MED ORDER — EPHEDRINE 5 MG/ML INJ: 10.0000 mg | INTRAVENOUS | Status: DC | PRN

## 2019-02-06 MED ORDER — LIDOCAINE HCL (PF) 1 % IJ SOLN: 30.0000 mL | INTRAMUSCULAR | Status: DC | PRN

## 2019-02-06 MED ORDER — OXYTOCIN 40 UNITS IN NORMAL SALINE INFUSION - SIMPLE MED
1.0000 m[IU]/min | INTRAVENOUS | Status: DC
  Administered 2019-02-06: 08:00:00 2 m[IU]/min via INTRAVENOUS
  Administered 2019-02-06: 1 m[IU]/min via INTRAVENOUS

## 2019-02-06 MED ORDER — SOD CITRATE-CITRIC ACID 500-334 MG/5ML PO SOLN
30.0000 mL | ORAL | Status: DC | PRN
  Filled 2019-02-06: qty 30

## 2019-02-06 MED ORDER — LACTATED RINGERS AMNIOINFUSION
INTRAVENOUS | Status: DC
  Administered 2019-02-06 (×2): via INTRAUTERINE

## 2019-02-06 MED ORDER — OXYCODONE-ACETAMINOPHEN 5-325 MG PO TABS: 2.0000 | ORAL_TABLET | ORAL | Status: DC | PRN

## 2019-02-06 MED ORDER — FLEET ENEMA 7-19 GM/118ML RE ENEM: 1.0000 | ENEMA | RECTAL | Status: DC | PRN

## 2019-02-06 MED ORDER — LACTATED RINGERS IV SOLN
INTRAVENOUS | Status: DC
  Administered 2019-02-06 (×4): via INTRAVENOUS

## 2019-02-06 MED ORDER — SODIUM CHLORIDE (PF) 0.9 % IJ SOLN
INTRAMUSCULAR | Status: DC | PRN
  Administered 2019-02-06: 12 mL/h via EPIDURAL

## 2019-02-06 MED ORDER — OXYCODONE-ACETAMINOPHEN 5-325 MG PO TABS: 1.0000 | ORAL_TABLET | ORAL | Status: DC | PRN

## 2019-02-06 MED ORDER — MISOPROSTOL 50MCG HALF TABLET
50.0000 ug | ORAL_TABLET | ORAL | Status: DC
  Administered 2019-02-06: 04:00:00 50 ug via ORAL
  Filled 2019-02-06: qty 1

## 2019-02-06 MED ORDER — OXYTOCIN 40 UNITS IN NORMAL SALINE INFUSION - SIMPLE MED
2.5000 [IU]/h | INTRAVENOUS | Status: DC
  Filled 2019-02-06: qty 1000

## 2019-02-06 MED ORDER — SODIUM CHLORIDE 0.9 % IV SOLN
5.0000 10*6.[IU] | Freq: Once | INTRAVENOUS | Status: AC
  Administered 2019-02-06: 5 10*6.[IU] via INTRAVENOUS
  Filled 2019-02-06: qty 5

## 2019-02-06 NOTE — H&P (Addendum)
Mia Mccoy is a 23 y.o. female G1P0 at [redacted]w[redacted]d pt of [facility] presenting for PROM at 0145 am.  Fluid is clear and pt reports some mild cramping but no pain.  The baby is moving well. Pregnancy was complicated by anemia and LGA with EFW 3115gm, 81%tile 01/18/19.  No HTN history before or during pregnancy. She denies h/a, epigastric pain, or visual disturbances.   Nursing Staff Provider  Office Location  [facility] Dating  U/S  Language  English Anatomy US  Nml at 18 wks > f/u 4-6 weeks, face/heart not well visualized> f/u normal  Flu Vaccine   Declined Genetic Screen  NIPS: low risks   AFP:   neg    TDaP vaccine   11/26/2018 Hgb A1C or  GTT Early 5.6 Third trimester 87-134-109 (nml)  Rhogam  N/A   LAB RESULTS   Feeding Plan Breast Blood Type O/Positive/-- (11/12 0955)   Contraception Undecided Antibody Negative (11/12 0955)  Circumcision If boy, yes Rubella 11.50 (11/12 0955)  Pediatrician  Undecided RPR Non Reactive (11/12 0955)   Support Person Sister Nicole Cella HBsAg Negative (11/12 0955)   Prenatal Classes No HIV Non Reactive (11/12 0955)  BTL Consent n/a GBS  (For PCN allergy, check sensitivities)   VBAC Consent n/a Pap Negative 11/19    Hgb Electro  Negative    CF Negative for the 97 mutations     SMA SMN1 copy number: 3 (Reduced Carrier Risk)    Waterbirth  [ ]  Class [ ]  Consent [ ]  CNM visit    OB History    Gravida  1   Para      Term      Preterm      AB      Living        SAB      TAB      Ectopic      Multiple      Live Births             Past Medical History:  Diagnosis Date  . Medical history non-contributory   . Thyroid disorder    Past Surgical History:  Procedure Laterality Date  . NO PAST SURGERIES    . WISDOM TOOTH EXTRACTION     Family History: family history is not on file. Social History:  reports that she has never smoked. She has never used smokeless tobacco. She reports previous drug use. Drug: Marijuana. She reports  that she does not drink alcohol.     Maternal Diabetes: No Genetic Screening: Normal Maternal Ultrasounds/Referrals: Normal Fetal Ultrasounds or other Referrals:  None Maternal Substance Abuse:  No Significant Maternal Medications:  None Significant Maternal Lab Results:  Lab values include: Group B Strep positive Other Comments:  None  ROS Maternal Medical History:  Reason for admission: Rupture of membranes.   Contractions: Onset was less than 1 hour ago.   Perceived severity is mild.    Fetal activity: Perceived fetal activity is normal.   Last perceived fetal movement was within the past hour.    Prenatal complications: no prenatal complications Prenatal Complications - Diabetes: none.    Dilation: 1 Effacement (%): Thick Exam by:: Suezanne Jacquet, RN Blood pressure (!) 143/75, pulse (!) 106, temperature 99.2 F (37.3 C), resp. rate 18, height 4\' 11"  (1.499 m), weight 95.7 kg, last menstrual period 05/03/2018, SpO2 98 %.   Confirmed vertex position by bedside US by Sharen Counter, CNM Limited OB US Date: 02/06/19 EDD : 02/13/19 based  on 6 week US Fetal position: Vertex noted with cranial features identified in pelvis on today's US Pt informed that the ultrasound is considered a limited OB ultrasound and is not intended to be a complete ultrasound exam.  Patient also informed that the ultrasound is not being completed with the intent of assessing for fetal or placental anomalies or any pelvic abnormalities.  Explained that the purpose of today's ultrasound is to assess for  presentation.  Patient acknowledges the purpose of the exam and the limitations of the study.     Maternal Exam:  Uterine Assessment: Contraction strength is mild.  Contraction frequency is regular.   Abdomen: Fetal presentation: vertex  Introitus: Ferning test: positive.  Amniotic fluid character: clear.  Cervix: Cervix evaluated by digital exam.     Fetal Exam Fetal Monitor Review: Mode:  ultrasound.   Baseline rate: 135.  Variability: moderate (6-25 bpm).   Pattern: no accelerations.    Fetal State Assessment: Category I - tracings are normal.     Physical Exam  Nursing note and vitals reviewed. Constitutional: She is oriented to person, place, and time. She appears well-developed and well-nourished.  Neck: Normal range of motion.  Cardiovascular: Normal rate, regular rhythm and normal heart sounds.  Respiratory: Effort normal and breath sounds normal.  GI: Soft.  Musculoskeletal: Normal range of motion.  Neurological: She is alert and oriented to person, place, and time.  Skin: Skin is warm and dry.  Psychiatric: She has a normal mood and affect. Her behavior is normal. Judgment and thought content normal.    Prenatal labs: ABO, Rh: O/Positive/-- (11/12 0955) Antibody: Negative (11/12 0955) Rubella: 11.50 (11/12 0955) RPR: Non Reactive (03/20 0831)  HBsAg: Negative (11/12 0955)  HIV: Non Reactive (03/20 0831)  GBS:   Positive, PCN ordered  Assessment/Plan: 23 y.o. G1P0 at 4867w0d PROM/early labor GBS positive HTN in pregnancy (on admission today only)  Admit to L&D PCN for GBS prophylaxis PEC labs pending Expectant management on admission Anticipate NSVD   Sharen CounterLisa Leftwich-Kirby 02/06/2019, 2:30 AM

## 2019-02-06 NOTE — Plan of Care (Signed)
POC reviewed with pt and support person, understanding verbalized by pt, safety maintained.

## 2019-02-06 NOTE — MAU Note (Signed)
Pt reports she was asleep when she woke up to a gush of clear fluid around 1:45am. Reports that the fluid keeps coming out. Pt denies vaginal bleeding or pain. Reports good fetal movement. Pt denies any complications with this pregnancy.

## 2019-02-06 NOTE — Progress Notes (Signed)
Pt wants to wait a bit before cytotec to see if she will start laboring on her own. Discussed risk of infection is increased after 18 hours ROM, understands.  FHR Cat 1.

## 2019-02-06 NOTE — Progress Notes (Signed)
LABOR PROGRESS NOTE  Mia Mccoy is a 23 y.o. G2P0 at [redacted]w[redacted]d  admitted for PROM.   Subjective: Strip note. Discussed patient with RN>   Objective: BP 123/64   Pulse (!) 114   Temp 98.7 F (37.1 C) (Oral)   Resp 16   Ht 4\' 11"  (1.499 m)   Wt 95.7 kg   LMP 05/03/2018 (Approximate)   SpO2 98%   Breastfeeding Unknown   BMI 42.60 kg/m  or  Vitals:   02/06/19 1201 02/06/19 1231 02/06/19 1301 02/06/19 1331  BP: 122/75 119/85 128/75 123/64  Pulse: (!) 115 (!) 105 (!) 122 (!) 114  Resp: 16 16    Temp: 98.7 F (37.1 C)     TempSrc: Oral     SpO2:      Weight:      Height:       Dilation: 5 Effacement (%): 80, 90 Station: -2 Presentation: Vertex Exam by:: k fields, rn FHT: baseline rate 150, moderate varibility, +acel, variable decel Toco: q1-4 min   Labs: Lab Results  Component Value Date   WBC 10.8 (H) 02/06/2019   HGB 12.4 02/06/2019   HCT 38.7 02/06/2019   MCV 77.6 (L) 02/06/2019   PLT 209 02/06/2019    Patient Active Problem List   Diagnosis Date Noted  . GBS (group B Streptococcus carrier), +RV culture, currently pregnant 02/06/2019  . Indication for care in labor or delivery 02/06/2019  . Uterine size date discrepancy pregnancy, third trimester 01/13/2019  . Excessive weight gain during pregnancy in third trimester 01/13/2019  . Anemia affecting pregnancy 12/10/2018  . Supervision of normal first pregnancy, antepartum 07/20/2018  . Immunization due 07/07/2017  . Family planning 07/07/2017  . Marijuana use 07/07/2017    Assessment / Plan: 23 y.o. G2P0 at [redacted]w[redacted]d here for PROM.   Labor: Patient currently on Pitocin at 2 mu/min and is making steady cervical change.  Fetal Wellbeing:  Cat II. Appears better after position changes and continues to have moderate variability. If variable decels persist, will plan to place IUPC and start amnioinfusion.  Pain Control:  Epidural in place  Anticipated MOD:  NSVD   Marcy Siren, D.O. OB Fellow   02/06/2019, 1:50 PM

## 2019-02-06 NOTE — Anesthesia Procedure Notes (Signed)
Epidural Patient location during procedure: OB Start time: 02/06/2019 9:02 AM End time: 02/06/2019 9:21 AM  Staffing Anesthesiologist: Achille Rich, MD Performed: anesthesiologist   Preanesthetic Checklist Completed: patient identified, site marked, pre-op evaluation, timeout performed, IV checked, risks and benefits discussed and monitors and equipment checked  Epidural Patient position: sitting Prep: DuraPrep Patient monitoring: heart rate, cardiac monitor, continuous pulse ox and blood pressure Approach: midline Location: L2-L3 Injection technique: LOR air  Needle:  Needle type: Tuohy  Needle gauge: 17 G Needle length: 9 cm Needle insertion depth: 7 cm Catheter type: closed end flexible Catheter size: 19 Gauge Catheter at skin depth: 13 cm Test dose: negative and Other  Assessment Events: blood not aspirated, injection not painful, no injection resistance and negative IV test  Additional Notes Informed consent obtained prior to proceeding including risk of failure, 1% risk of PDPH, risk of minor discomfort and bruising.  Discussed rare but serious complications including epidural abscess, permanent nerve injury, epidural hematoma.  Discussed alternatives to epidural analgesia and patient desires to proceed.  Timeout performed pre-procedure verifying patient name, procedure, and platelet count.  Patient tolerated procedure well. Reason for block:procedure for pain

## 2019-02-06 NOTE — Anesthesia Preprocedure Evaluation (Signed)
Anesthesia Evaluation  Patient identified by MRN, date of birth, ID band Patient awake    Reviewed: Allergy & Precautions, H&P , NPO status , Patient's Chart, lab work & pertinent test results  Airway Mallampati: II   Neck ROM: full    Dental   Pulmonary neg pulmonary ROS,    breath sounds clear to auscultation       Cardiovascular negative cardio ROS   Rhythm:regular Rate:Normal     Neuro/Psych    GI/Hepatic   Endo/Other  Morbid obesity  Renal/GU      Musculoskeletal   Abdominal   Peds  Hematology   Anesthesia Other Findings   Reproductive/Obstetrics (+) Pregnancy                             Anesthesia Physical Anesthesia Plan  ASA: II  Anesthesia Plan: Epidural   Post-op Pain Management:    Induction: Intravenous  PONV Risk Score and Plan: 2 and Treatment may vary due to age or medical condition  Airway Management Planned: Natural Airway  Additional Equipment:   Intra-op Plan:   Post-operative Plan:   Informed Consent: I have reviewed the patients History and Physical, chart, labs and discussed the procedure including the risks, benefits and alternatives for the proposed anesthesia with the patient or authorized representative who has indicated his/her understanding and acceptance.       Plan Discussed with: Anesthesiologist  Anesthesia Plan Comments:         Anesthesia Quick Evaluation

## 2019-02-07 ENCOUNTER — Encounter (HOSPITAL_COMMUNITY)
Admission: EM | Disposition: A | Discharge status: Home / Self Care | Payer: Self-pay | Source: Ambulatory Visit | Attending: Family Medicine

## 2019-02-07 ENCOUNTER — Encounter (HOSPITAL_COMMUNITY): Payer: Self-pay

## 2019-02-07 DIAGNOSIS — Z30430 Encounter for insertion of intrauterine contraceptive device: Secondary | ICD-10-CM

## 2019-02-07 DIAGNOSIS — Z3A40 40 weeks gestation of pregnancy: Secondary | ICD-10-CM

## 2019-02-07 LAB — COMPREHENSIVE METABOLIC PANEL
ALT: 12 U/L (ref 0–44)
AST: 25 U/L (ref 15–41)
Albumin: 2.1 g/dL — ABNORMAL LOW (ref 3.5–5.0)
Alkaline Phosphatase: 123 U/L (ref 38–126)
Anion gap: 7 (ref 5–15)
BUN: 11 mg/dL (ref 6–20)
CO2: 22 mmol/L (ref 22–32)
Calcium: 8.2 mg/dL — ABNORMAL LOW (ref 8.9–10.3)
Chloride: 108 mmol/L (ref 98–111)
Creatinine, Ser: 1.01 mg/dL — ABNORMAL HIGH (ref 0.44–1.00)
GFR calc Af Amer: >60 mL/min (ref >60)
GFR calc non Af Amer: >60 mL/min (ref >60)
Glucose, Bld: 102 mg/dL — ABNORMAL HIGH (ref 70–99)
Potassium: 4.3 mmol/L (ref 3.5–5.1)
Sodium: 137 mmol/L (ref 135–145)
Total Bilirubin: 0.4 mg/dL (ref 0.3–1.2)
Total Protein: 4.2 g/dL — ABNORMAL LOW (ref 6.5–8.1)

## 2019-02-07 LAB — CREATININE, SERUM
Creatinine, Ser: 1.25 mg/dL — ABNORMAL HIGH (ref 0.44–1.00)
GFR calc Af Amer: >60 mL/min (ref >60)
GFR calc non Af Amer: >60 mL/min (ref >60)

## 2019-02-07 LAB — ABO/RH: ABO/RH(D): O POS

## 2019-02-07 LAB — CBC
HCT: 28.8 % — ABNORMAL LOW (ref 36.0–46.0)
HCT: 23.9 % — ABNORMAL LOW (ref 36.0–46.0)
Hemoglobin: 9.3 g/dL — ABNORMAL LOW (ref 12.0–15.0)
Hemoglobin: 7.7 g/dL — ABNORMAL LOW (ref 12.0–15.0)
MCH: 25.1 pg — ABNORMAL LOW (ref 26.0–34.0)
MCH: 25.1 pg — ABNORMAL LOW (ref 26.0–34.0)
MCHC: 32.3 g/dL (ref 30.0–36.0)
MCHC: 32.2 g/dL (ref 30.0–36.0)
MCV: 77.8 fL — ABNORMAL LOW (ref 80.0–100.0)
MCV: 77.9 fL — ABNORMAL LOW (ref 80.0–100.0)
Platelets: 144 10*3/uL — ABNORMAL LOW (ref 150–400)
Platelets: 144 10*3/uL — ABNORMAL LOW (ref 150–400)
RBC: 3.7 MIL/uL — ABNORMAL LOW (ref 3.87–5.11)
RBC: 3.07 MIL/uL — ABNORMAL LOW (ref 3.87–5.11)
RDW: 15.4 % (ref 11.5–15.5)
RDW: 15.5 % (ref 11.5–15.5)
WBC: 18.5 10*3/uL — ABNORMAL HIGH (ref 4.0–10.5)
WBC: 17.8 10*3/uL — ABNORMAL HIGH (ref 4.0–10.5)
nRBC: 0 % (ref 0.0–0.2)
nRBC: 0 % (ref 0.0–0.2)

## 2019-02-07 SURGERY — Surgical Case
Anesthesia: Epidural | Laterality: Bilateral | Wound class: Clean Contaminated

## 2019-02-07 MED ORDER — BUPIVACAINE HCL (PF) 0.25 % IJ SOLN
INTRAMUSCULAR | Status: DC | PRN
  Administered 2019-02-07: 40 mg

## 2019-02-07 MED ORDER — SIMETHICONE 80 MG PO CHEW
80.0000 mg | CHEWABLE_TABLET | Freq: Three times a day (TID) | ORAL | Status: DC
  Administered 2019-02-07 – 2019-02-09 (×9): 80 mg via ORAL
  Filled 2019-02-07 (×9): qty 1

## 2019-02-07 MED ORDER — LIDOCAINE-EPINEPHRINE (PF) 2 %-1:200000 IJ SOLN
INTRAMUSCULAR | Status: DC | PRN
  Administered 2019-02-07: 7 mL via EPIDURAL

## 2019-02-07 MED ORDER — PHENYLEPHRINE 40 MCG/ML (10ML) SYRINGE FOR IV PUSH (FOR BLOOD PRESSURE SUPPORT)
PREFILLED_SYRINGE | INTRAVENOUS | Status: AC
  Filled 2019-02-07: qty 10

## 2019-02-07 MED ORDER — NALBUPHINE HCL 10 MG/ML IJ SOLN: 5.0000 mg | Freq: Once | INTRAMUSCULAR | Status: DC | PRN

## 2019-02-07 MED ORDER — ONDANSETRON HCL 4 MG/2ML IJ SOLN: 4.0000 mg | Freq: Three times a day (TID) | INTRAMUSCULAR | Status: DC | PRN

## 2019-02-07 MED ORDER — DIPHENHYDRAMINE HCL 25 MG PO CAPS: 25.0000 mg | ORAL_CAPSULE | Freq: Four times a day (QID) | ORAL | Status: DC | PRN

## 2019-02-07 MED ORDER — ALBUMIN HUMAN 5 % IV SOLN
INTRAVENOUS | Status: AC
  Filled 2019-02-07: qty 250

## 2019-02-07 MED ORDER — OXYTOCIN 40 UNITS IN NORMAL SALINE INFUSION - SIMPLE MED: 2.5000 [IU]/h | INTRAVENOUS | Status: AC

## 2019-02-07 MED ORDER — MEASLES, MUMPS & RUBELLA VAC IJ SOLR: 0.5000 mL | Freq: Once | INTRAMUSCULAR | Status: DC

## 2019-02-07 MED ORDER — NALBUPHINE HCL 10 MG/ML IJ SOLN: 5.0000 mg | INTRAMUSCULAR | Status: DC | PRN

## 2019-02-07 MED ORDER — SIMETHICONE 80 MG PO CHEW
80.0000 mg | CHEWABLE_TABLET | ORAL | Status: DC
  Administered 2019-02-07 – 2019-02-09 (×2): 80 mg via ORAL
  Filled 2019-02-07 (×2): qty 1

## 2019-02-07 MED ORDER — DEXTROSE IN LACTATED RINGERS 5 % IV SOLN: INTRAVENOUS | Status: DC

## 2019-02-07 MED ORDER — SODIUM CHLORIDE 0.9 % IV SOLN
INTRAVENOUS | Status: DC | PRN
  Administered 2019-02-07: 01:00:00 via INTRAVENOUS

## 2019-02-07 MED ORDER — ENOXAPARIN SODIUM 60 MG/0.6ML ~~LOC~~ SOLN
50.0000 mg | SUBCUTANEOUS | Status: DC
  Administered 2019-02-07 – 2019-02-08 (×2): 50 mg via SUBCUTANEOUS
  Filled 2019-02-07 (×2): qty 0.6

## 2019-02-07 MED ORDER — CEFAZOLIN SODIUM-DEXTROSE 2-4 GM/100ML-% IV SOLN
2.0000 g | Freq: Once | INTRAVENOUS | Status: AC
  Administered 2019-02-07: 2 g via INTRAVENOUS
  Filled 2019-02-07: qty 100

## 2019-02-07 MED ORDER — WITCH HAZEL-GLYCERIN EX PADS: 1.0000 "application " | MEDICATED_PAD | CUTANEOUS | Status: DC | PRN

## 2019-02-07 MED ORDER — ONDANSETRON HCL 4 MG/2ML IJ SOLN
INTRAMUSCULAR | Status: DC | PRN
  Administered 2019-02-07: 4 mg via INTRAVENOUS

## 2019-02-07 MED ORDER — DEXAMETHASONE SODIUM PHOSPHATE 4 MG/ML IJ SOLN
INTRAMUSCULAR | Status: AC
  Filled 2019-02-07: qty 1

## 2019-02-07 MED ORDER — LEVONORGESTREL 19.5 MCG/DAY IU IUD
INTRAUTERINE_SYSTEM | INTRAUTERINE | Status: AC
  Administered 2019-02-07: 1
  Filled 2019-02-07: qty 1

## 2019-02-07 MED ORDER — BUPIVACAINE-EPINEPHRINE (PF) 0.5% -1:200000 IJ SOLN
INTRAMUSCULAR | Status: AC
  Filled 2019-02-07: qty 30

## 2019-02-07 MED ORDER — TETANUS-DIPHTH-ACELL PERTUSSIS 5-2.5-18.5 LF-MCG/0.5 IM SUSP: 0.5000 mL | Freq: Once | INTRAMUSCULAR | Status: DC

## 2019-02-07 MED ORDER — KETOROLAC TROMETHAMINE 30 MG/ML IJ SOLN: 30.0000 mg | Freq: Four times a day (QID) | INTRAMUSCULAR | Status: DC | PRN

## 2019-02-07 MED ORDER — ONDANSETRON HCL 4 MG/2ML IJ SOLN
INTRAMUSCULAR | Status: AC
  Filled 2019-02-07: qty 2

## 2019-02-07 MED ORDER — ZOLPIDEM TARTRATE 5 MG PO TABS: 5.0000 mg | ORAL_TABLET | Freq: Every evening | ORAL | Status: DC | PRN

## 2019-02-07 MED ORDER — SCOPOLAMINE 1 MG/3DAYS TD PT72
MEDICATED_PATCH | TRANSDERMAL | Status: AC
  Filled 2019-02-07: qty 1

## 2019-02-07 MED ORDER — SCOPOLAMINE 1 MG/3DAYS TD PT72
1.0000 | MEDICATED_PATCH | Freq: Once | TRANSDERMAL | Status: DC
  Administered 2019-02-07: 05:00:00 1.5 mg via TRANSDERMAL
  Filled 2019-02-07: qty 1

## 2019-02-07 MED ORDER — DIPHENHYDRAMINE HCL 25 MG PO CAPS: 25.0000 mg | ORAL_CAPSULE | ORAL | Status: DC | PRN

## 2019-02-07 MED ORDER — DIPHENHYDRAMINE HCL 50 MG/ML IJ SOLN: 12.5000 mg | INTRAMUSCULAR | Status: DC | PRN

## 2019-02-07 MED ORDER — SOD CITRATE-CITRIC ACID 500-334 MG/5ML PO SOLN
30.0000 mL | ORAL | Status: AC
  Administered 2019-02-07: 30 mL via ORAL

## 2019-02-07 MED ORDER — IBUPROFEN 800 MG PO TABS
800.0000 mg | ORAL_TABLET | Freq: Four times a day (QID) | ORAL | Status: DC
  Administered 2019-02-08 – 2019-02-09 (×6): 800 mg via ORAL
  Filled 2019-02-07 (×6): qty 1

## 2019-02-07 MED ORDER — LACTATED RINGERS IV SOLN
INTRAVENOUS | Status: DC | PRN
  Administered 2019-02-07 (×2): via INTRAVENOUS

## 2019-02-07 MED ORDER — SENNOSIDES-DOCUSATE SODIUM 8.6-50 MG PO TABS
2.0000 | ORAL_TABLET | ORAL | Status: DC
  Administered 2019-02-07 – 2019-02-09 (×2): 2 via ORAL
  Filled 2019-02-07 (×2): qty 2

## 2019-02-07 MED ORDER — LACTATED RINGERS IV SOLN
INTRAVENOUS | Status: DC | PRN
  Administered 2019-02-07 (×2): via INTRAVENOUS

## 2019-02-07 MED ORDER — BUPIVACAINE HCL (PF) 0.25 % IJ SOLN
INTRAMUSCULAR | Status: AC
  Filled 2019-02-07: qty 40

## 2019-02-07 MED ORDER — OXYCODONE HCL 5 MG PO TABS
5.0000 mg | ORAL_TABLET | ORAL | Status: DC | PRN
  Administered 2019-02-08: 20:00:00 5 mg via ORAL
  Filled 2019-02-07: qty 1

## 2019-02-07 MED ORDER — SIMETHICONE 80 MG PO CHEW: 80.0000 mg | CHEWABLE_TABLET | ORAL | Status: DC | PRN

## 2019-02-07 MED ORDER — SODIUM CHLORIDE 0.9 % IR SOLN
Status: DC | PRN
  Administered 2019-02-07: 1

## 2019-02-07 MED ORDER — DEXAMETHASONE SODIUM PHOSPHATE 4 MG/ML IJ SOLN
INTRAMUSCULAR | Status: DC | PRN
  Administered 2019-02-07: 4 mg via INTRAVENOUS

## 2019-02-07 MED ORDER — OXYTOCIN 40 UNITS IN NORMAL SALINE INFUSION - SIMPLE MED
INTRAVENOUS | Status: AC
  Filled 2019-02-07: qty 1000

## 2019-02-07 MED ORDER — KETOROLAC TROMETHAMINE 30 MG/ML IJ SOLN
30.0000 mg | Freq: Four times a day (QID) | INTRAMUSCULAR | Status: AC
  Administered 2019-02-07 (×4): 30 mg via INTRAVENOUS
  Filled 2019-02-07 (×4): qty 1

## 2019-02-07 MED ORDER — ALBUMIN HUMAN 5 % IV SOLN
INTRAVENOUS | Status: DC | PRN
  Administered 2019-02-07: 01:00:00 via INTRAVENOUS

## 2019-02-07 MED ORDER — MENTHOL 3 MG MT LOZG: 1.0000 | LOZENGE | OROMUCOSAL | Status: DC | PRN

## 2019-02-07 MED ORDER — SCOPOLAMINE 1 MG/3DAYS TD PT72
MEDICATED_PATCH | TRANSDERMAL | Status: DC | PRN
  Administered 2019-02-07: 1 via TRANSDERMAL

## 2019-02-07 MED ORDER — COCONUT OIL OIL: 1.0000 "application " | TOPICAL_OIL | Status: DC | PRN

## 2019-02-07 MED ORDER — FERROUS SULFATE 325 (65 FE) MG PO TABS
325.0000 mg | ORAL_TABLET | Freq: Two times a day (BID) | ORAL | Status: DC
  Administered 2019-02-08 – 2019-02-09 (×3): 325 mg via ORAL
  Filled 2019-02-07 (×4): qty 1

## 2019-02-07 MED ORDER — NALOXONE HCL 0.4 MG/ML IJ SOLN: 0.4000 mg | INTRAMUSCULAR | Status: DC | PRN

## 2019-02-07 MED ORDER — PHENYLEPHRINE HCL (PRESSORS) 10 MG/ML IV SOLN
INTRAVENOUS | Status: DC | PRN
  Administered 2019-02-07: 40 ug via INTRAVENOUS
  Administered 2019-02-07 (×3): 80 ug via INTRAVENOUS
  Administered 2019-02-07: 40 ug via INTRAVENOUS
  Administered 2019-02-07: 80 ug via INTRAVENOUS

## 2019-02-07 MED ORDER — GABAPENTIN 100 MG PO CAPS
100.0000 mg | ORAL_CAPSULE | Freq: Two times a day (BID) | ORAL | Status: DC
  Administered 2019-02-07 – 2019-02-09 (×5): 100 mg via ORAL
  Filled 2019-02-07 (×5): qty 1

## 2019-02-07 MED ORDER — STERILE WATER FOR IRRIGATION IR SOLN
Status: DC | PRN
  Administered 2019-02-07: 1

## 2019-02-07 MED ORDER — SODIUM CHLORIDE 0.9% FLUSH: 3.0000 mL | INTRAVENOUS | Status: DC | PRN

## 2019-02-07 MED ORDER — PRENATAL MULTIVITAMIN CH
1.0000 | ORAL_TABLET | Freq: Every day | ORAL | Status: DC
  Administered 2019-02-07 – 2019-02-09 (×3): 1 via ORAL
  Filled 2019-02-07 (×3): qty 1

## 2019-02-07 MED ORDER — LACTATED RINGERS IV SOLN
INTRAVENOUS | Status: DC
  Administered 2019-02-07 (×3): via INTRAVENOUS

## 2019-02-07 MED ORDER — NALOXONE HCL 4 MG/10ML IJ SOLN
1.0000 ug/kg/h | INTRAVENOUS | Status: DC | PRN
  Filled 2019-02-07: qty 5

## 2019-02-07 MED ORDER — DIBUCAINE (PERIANAL) 1 % EX OINT: 1.0000 "application " | TOPICAL_OINTMENT | CUTANEOUS | Status: DC | PRN

## 2019-02-07 MED ORDER — MORPHINE SULFATE (PF) 0.5 MG/ML IJ SOLN
INTRAMUSCULAR | Status: AC
  Filled 2019-02-07: qty 10

## 2019-02-07 MED ORDER — SODIUM CHLORIDE 0.9 % IV SOLN
500.0000 mg | INTRAVENOUS | Status: AC
  Administered 2019-02-07: 500 mg via INTRAVENOUS
  Filled 2019-02-07: qty 500

## 2019-02-07 MED ORDER — SODIUM CHLORIDE 0.9 % IV SOLN
INTRAVENOUS | Status: DC | PRN
  Administered 2019-02-07: 40 [IU] via INTRAVENOUS

## 2019-02-07 SURGICAL SUPPLY — 34 items
BENZOIN TINCTURE PRP APPL 2/3 (GAUZE/BANDAGES/DRESSINGS) ×3 IMPLANT
CHLORAPREP W/TINT 26ML (MISCELLANEOUS) ×3 IMPLANT
CLAMP CORD UMBIL (MISCELLANEOUS) IMPLANT
CLOSURE WOUND 1/2 X4 (GAUZE/BANDAGES/DRESSINGS) ×1
CLOTH BEACON ORANGE TIMEOUT ST (SAFETY) ×3 IMPLANT
DRSG OPSITE POSTOP 4X10 (GAUZE/BANDAGES/DRESSINGS) ×3 IMPLANT
ELECT REM PT RETURN 9FT ADLT (ELECTROSURGICAL) ×3
ELECTRODE REM PT RTRN 9FT ADLT (ELECTROSURGICAL) ×1 IMPLANT
EXTRACTOR VACUUM M CUP 4 TUBE (SUCTIONS) IMPLANT
EXTRACTOR VACUUM M CUP 4' TUBE (SUCTIONS)
GLOVE BIOGEL PI IND STRL 7.0 (GLOVE) ×2 IMPLANT
GLOVE BIOGEL PI INDICATOR 7.0 (GLOVE) ×4
GLOVE ECLIPSE 7.0 STRL STRAW (GLOVE) ×6 IMPLANT
GOWN STRL REUS W/TWL LRG LVL3 (GOWN DISPOSABLE) ×6 IMPLANT
HEMOSTAT ARISTA ABSORB 3G PWDR (HEMOSTASIS) ×3 IMPLANT
HOVERMATT SINGLE USE (MISCELLANEOUS) ×3 IMPLANT
KIT ABG SYR 3ML LUER SLIP (SYRINGE) IMPLANT
NEEDLE HYPO 22GX1.5 SAFETY (NEEDLE) ×3 IMPLANT
NEEDLE HYPO 25X5/8 SAFETYGLIDE (NEEDLE) IMPLANT
NS IRRIG 1000ML POUR BTL (IV SOLUTION) ×3 IMPLANT
PACK C SECTION WH (CUSTOM PROCEDURE TRAY) ×3 IMPLANT
PAD ABD 7.5X8 STRL (GAUZE/BANDAGES/DRESSINGS) ×3 IMPLANT
PAD OB MATERNITY 4.3X12.25 (PERSONAL CARE ITEMS) ×3 IMPLANT
PENCIL SMOKE EVAC W/HOLSTER (ELECTROSURGICAL) ×3 IMPLANT
RTRCTR C-SECT PINK 25CM LRG (MISCELLANEOUS) ×3 IMPLANT
SPONGE GAUZE 4X4 12PLY STER LF (GAUZE/BANDAGES/DRESSINGS) ×3 IMPLANT
STRIP CLOSURE SKIN 1/2X4 (GAUZE/BANDAGES/DRESSINGS) ×2 IMPLANT
SUT VIC AB 0 CTX 36 (SUTURE) ×6
SUT VIC AB 0 CTX36XBRD ANBCTRL (SUTURE) ×3 IMPLANT
SUT VIC AB 4-0 KS 27 (SUTURE) ×3 IMPLANT
SYR 30ML LL (SYRINGE) ×3 IMPLANT
TOWEL OR 17X24 6PK STRL BLUE (TOWEL DISPOSABLE) ×3 IMPLANT
TRAY FOLEY W/BAG SLVR 14FR LF (SET/KITS/TRAYS/PACK) ×3 IMPLANT
WATER STERILE IRR 1000ML POUR (IV SOLUTION) ×3 IMPLANT

## 2019-02-07 NOTE — Op Note (Signed)
Preoperative Diagnosis:  IUP @ 106w0d, Arrest of dilation  Postoperative Diagnosis:  Same, OP position  Procedure: Primary low transverse cesarean section Liletta placement  Surgeon: Tinnie Gens, M.D.  Assistant: None  Findings: Viable female infant, APGAR (1 MIN): 1   APGAR (5 MINS): 7   APGAR (10 MINS): 8 , weight pending, vtx presentation, direct OP position  Estimated blood loss: 1000 cc  Complications: None known  Specimens: Placenta to labor and delivery  Reason for procedure: Briefly, the patient is a 23 y.o. G2P0 [redacted]w[redacted]d who presents for being 9 cm x 6 hours with adequate labor and pitocin. Baby is direct OP.  Procedure: Patient is a to the OR where spinal analgesia was administered. She was then placed in a supine position with left lateral tilt. She received 2 g of Ancef and Azithromycin SCDs were in place. A timeout was performed. She was prepped and draped in the usual sterile fashion. A Foley catheter was placed in the bladder. A knife was then used to make a Pfannenstiel incision. This incision was carried out to underlying fascia which was divided in the midline with the knife The incision was extended laterally, sharply.The rectus was divided in the midline. The peritoneal cavity was entered bluntly. Alexis retractor was placed inside the incision. A knife was used to make a low transverse incision on the uterus. This incision was carried down to the amniotic cavity was entered. Fetus was in direct OP position and was brought up out of the incision without difficulty. Cord was clamped x 2 and cut. Infant taken to waiting nurse. Cord blood was obtained. Placenta was delivered from the uterus.  Uterus was cleaned with dry lap pads. Liletta IUD placed after delivery. Uterine incision closed with 0 Vicryl suture in a locked running fashion. A second layer of 0 Vicryl in an imbricating fashion was used to achieve hemostasis. Arista used to continue hemostasis. Alexis retractor was  removed from the abdomen. Peritoneal closure was done with 0 Vicryl suture. Fascia is closed with 0 Vicryl suture in a running fashion. Subcutaneous tissue infused with 30cc 0.25% Marcaine. Subcutaneous closure was performed with 0 plain suture. Skin closed using 3-0 Vicryl on a Keith needle. Steri strips applied, followed by pressure dressing. All instrument, needle and lap counts were correct x 2. Patient was awake and taken to PACU stable. Infant remained with mom in couplet care, stable.   Shelbie Proctor PrattMD 02/07/2019 1:43 AM

## 2019-02-07 NOTE — Progress Notes (Signed)
Late entry:   LABOR PROGRESS NOTE  Mia Mccoy is a 23 y.o. G2P0 at [redacted]w[redacted]d  admitted for PROM on 5/31 @ 0145  Subjective: Patient comfortable with epidural, intermittent pressure in bottom   Objective: BP (!) 119/55 (BP Location: Right Arm)   Pulse 85   Temp 99.2 F (37.3 C) (Axillary)   Resp 18   Ht 4\' 11"  (1.499 m)   Wt 95.7 kg   LMP 05/03/2018 (Approximate)   SpO2 100%   Breastfeeding Unknown   BMI 42.60 kg/m  or  Vitals:   02/06/19 2230 02/06/19 2300 02/06/19 2330 02/07/19 0000  BP: 117/73 130/71 121/64 (!) 119/55  Pulse: 99 (!) 104 91 85  Resp: 20 20 20 18   Temp:    99.2 F (37.3 C)  TempSrc:    Axillary  SpO2: 100% 100% 100% 100%  Weight:      Height:        FSE placed @ 2032, cervix unchanged, 9cm since 1830 Dilation: 9 Effacement (%): 90 Station: 0 Presentation: Vertex Exam by:: [place] CNM FHT: baseline rate 155, moderate varibility, +accel, variable and late decel Toco: , MVU 155   Labs: Lab Results  Component Value Date   WBC 10.8 (H) 02/06/2019   HGB 12.4 02/06/2019   HCT 38.7 02/06/2019   MCV 77.6 (L) 02/06/2019   PLT 209 02/06/2019    Patient Active Problem List   Diagnosis Date Noted  . GBS (group B Streptococcus carrier), +RV culture, currently pregnant 02/06/2019  . Indication for care in labor or delivery 02/06/2019  . Uterine size date discrepancy pregnancy, third trimester 01/13/2019  . Excessive weight gain during pregnancy in third trimester 01/13/2019  . Anemia affecting pregnancy 12/10/2018  . Supervision of normal first pregnancy, antepartum 07/20/2018  . Immunization due 07/07/2017  . Family planning 07/07/2017  . Marijuana use 07/07/2017    Assessment / Plan: 23 y.o. G2P0 at [redacted]w[redacted]d here for PROM   Labor: Failure to progress over the past 2 hours, position changed due to decelerations, plan to restart pitocin 30 minutes after last deceleration  Fetal Wellbeing:  Cat II  Pain Control:  Epidural  Anticipated  MOD:  Hopeful SVD  Sharyon Cable, CNM 02/07/2019, 12:56 AM

## 2019-02-07 NOTE — Lactation Note (Signed)
This note was copied from a baby's chart. Lactation Consultation Note  Patient Name: Girl Oceola Chakrabarti [id number] Date: 02/07/2019 Reason for consult: Difficult latch;Mother's request P1, 19 hour female infant, mom requesting latch assistance. LC entered room, dad holding infant in chair but not STS. Per dad,infant was given recently  formula at 7 pm. LC reviewed hand expression and mom taught back. Mom has used DEBP  previously and pumped 6 ml of colostrum that was siting on table.  LC notice mom has flat nipples and breast shells was given and explained how to use, mom knows to wear in her bra and not to sleep in them at night. Mom will stop pacifier use , LC saw pacifier on table and explained possible risk factors: non nutritive sucking, will mask cuing signals and nipple confusion. Mom will pre-pump with hand pump prior to latching infant to breast. Mom's  24 hour plan: 1. Will latch infant to breast at next feeding. 2. Will offer pumped breast milk after feeding infant at breast. 3. No longer use pacifier until 1 month of age. 4. Plans to use DEBP every 3 hours for 15 minutes on initial setting and give infant back EBM.Marland Kitchen 5. Family will do as much STS as possible.    Maternal Data    Feeding    LATCH Score                   Interventions    Lactation Tools Discussed/Used     Consult Status Consult Status: Follow-up Date: 02/08/19 Follow-up type: In-patient    Danelle Earthly 02/07/2019, 8:09 PM

## 2019-02-07 NOTE — Progress Notes (Signed)
Late entry:  Called to bedside by RN for prolonged deceleration   FHR down for 5 minutes, Dr Shawnie Pons called to bedside  Cervix unchanged Dilation: 9 Effacement (%): 90 Station: 0 Presentation: Vertex Exam by:: Suzette Battiest CNM  Interventions: scalp stimulation and position change  - FHR deceleration resolved after interventions   Baseline 160/ moderate/ +accels/ variable decelerations  CAT II tracing  IUPC: 2-3 minutes, MVUs 140  Sharyon Cable, CNM 02/07/19, 12:55 AM

## 2019-02-07 NOTE — Lactation Note (Signed)
This note was copied from a baby's chart. Lactation Consultation Note  Patient Name: Girl Akira Browder [id number] Date: 02/07/2019 Reason for consult: Initial assessment;Term  P1 mother whose infant is now 31 hours old.    RN in room with mother when I arrived.  Baby was crying and fussy.  Offered to assist with latching and mother accepted.  Mother's breasts are large, soft and non tender and nipples are inverted.  RN has assisted some with breast feeding but infant has not latched well.  Attempted to latch in the football hold on the left breast, however, infant was not able to focus on latching.  She continued to cry.  Attempted to calm her with my gloved finger and mother talking to her without success.  Suggested mother try a NS to assist baby to latch easier and mother agreeable.  #20 NS provided with instructions for use.  Inserted a small amount of formula in NS tip to entice infant to suck.  Baby began sucking in short bursts.  Repeated this 3 times before she was calm and content.  Demonstrated burping and placed baby on mother's chest STS.  After feeding her in this manner, mother stated, "I just really wanted to pump and bottle feed."  This was not her original plan but she is interested in using the bottle rather than her breasts.    With this information, I informed RN of mother's current plan and she was not aware of this plan either.  Offered to initiate the DEBP and mother accepted.  Pump parts, assembly, disassembly and cleaning reviewed.  Observed mother pumping and #24 flange size is appropriate at this time.  Mother was able to obtain a few mls of EBM from the right breast.  Encouraged her to use hand expression before/after pumping to help increase milk supply.  Colostrum container provided and milk storage times reviewed.  Finger feeding demonstrated.  Mother will pump every three hours and feed back any EBM she obtains from pumping.  She understands that, until she obtains  adequate volumes, baby will have to be supplemented with formula.  Encouraged mother to call for assistance with bottle feeding if she has difficulty.  Mother did not want to use the Similac nipples that were at bedside.  She stated that baby "does not like that nipple."  I provided the green slow flow nipple as an alternative and mother was satisfied with this choice.  RN updated.   Maternal Data Formula Feeding for Exclusion: No Has patient been taught Hand Expression?: Yes Does the patient have breastfeeding experience prior to this delivery?: No  Feeding Feeding Type: Breast Fed  LATCH Score Latch: Repeated attempts needed to sustain latch, nipple held in mouth throughout feeding, stimulation needed to elicit sucking reflex.(NS)  Audible Swallowing: A few with stimulation(using NS)  Type of Nipple: Inverted  Comfort (Breast/Nipple): Soft / non-tender  Hold (Positioning): Assistance needed to correctly position infant at breast and maintain latch.  LATCH Score: 5  Interventions Interventions: Breast feeding basics reviewed;Assisted with latch;Skin to skin;Breast massage;Hand express;Breast compression;Position options;Support pillows;Adjust position;DEBP  Lactation Tools Discussed/Used Tools: Pump Pump Review: Setup, frequency, and cleaning;Milk Storage Initiated by:: Laureen Ochs Date initiated:: 02/08/19   Consult Status Consult Status: Follow-up Date: 02/08/19 Follow-up type: In-patient    Dora Sims 02/07/2019, 4:24 PM

## 2019-02-07 NOTE — Progress Notes (Signed)
Patient has been 9 cm x 6 hours with recurrent variables. Adequate labor and pitocin and no change in cervix. For these reasons we have recommended abdominal delivery. She is amenable. Risks include but are not limited to bleeding, infection, injury to surrounding structures, including bowel, bladder and ureters, blood clots, and death.  Likelihood of success is high.

## 2019-02-07 NOTE — Discharge Summary (Signed)
Postpartum Discharge Summary     Patient Name: Mia Mccoy DOB: July 04, 1996 MRN: [id number]  Date of admission: 02/06/2019 Delivering Provider: Reva Bores   Date of discharge: 02/09/2019  Admitting diagnosis: water broke Intrauterine pregnancy: [redacted]w[redacted]d     Secondary diagnosis:  Active Problems:   GBS (group B Streptococcus carrier), +RV culture, currently pregnant   Indication for care in labor or delivery   Arrest of dilation, delivered, current hospitalization  Additional problems: Gestational HTN, acute blood loss anemia     Discharge diagnosis: Term Pregnancy Delivered and Gestational Hypertension                                                                                                Post partum procedures:IV feraheme   Augmentation: Pitocin and Cytotec  Complications: None  Hospital course:  Onset of Labor With Unplanned C/S  23 y.o. yo G2P0 at [redacted]w[redacted]d was admitted in Latent Labor with PROM on 02/06/2019. Patient had a labor course significant for arrest of dilation at 9 cm x 6 hours, with adequate labor and pitocin. Membrane Rupture Time/Date: 1:45 AM ,02/06/2019   The patient went for cesarean section due to Arrest of Dilation, and delivered a Viable infant,02/07/2019 and had postplacental IUD placed. Details of operation can be found in separate operative note. Patient had a normal postpartum course.  She is ambulating,tolerating a regular diet, passing flatus, and urinating well.  Patient is discharged home in stable condition 02/09/19. She denies dizziness at the time of discharge. She received IV Feraheme yesterday.   Magnesium Sulfate recieved: No BMZ received: No  Physical exam  Vitals:   02/08/19 0550 02/08/19 1247 02/08/19 2115 02/09/19 0552  BP: 110/72 (!) 126/59 128/62 105/84  Pulse: 94 80 82 76  Resp: 18 18 18 18   Temp: 98.6 F (37 C) 98.8 F (37.1 C) 98.6 F (37 C) 98.6 F (37 C)  TempSrc: Oral Oral Oral Oral  SpO2:  98%    Weight:       Height:       General: alert, cooperative and no distress Lochia: appropriate Uterine Fundus: firm Incision: Dressing is clean, dry, and intact DVT Evaluation: No evidence of DVT seen on physical exam. Labs: Lab Results  Component Value Date   WBC 13.8 (H) 02/08/2019   HGB 7.1 (L) 02/08/2019   HCT 22.2 (L) 02/08/2019   MCV 77.9 (L) 02/08/2019   PLT 146 (L) 02/08/2019   CMP Latest Ref Rng & Units 02/07/2019  Glucose 70 - 99 mg/dL 824(M)  BUN 6 - 20 mg/dL 11  Creatinine 3.53 - 6.14 mg/dL 4.31(V)  Sodium 400 - 867 mmol/L 137  Potassium 3.5 - 5.1 mmol/L 4.3  Chloride 98 - 111 mmol/L 108  CO2 22 - 32 mmol/L 22  Calcium 8.9 - 10.3 mg/dL 8.2(L)  Total Protein 6.5 - 8.1 g/dL 4.2(L)  Total Bilirubin 0.3 - 1.2 mg/dL 0.4  Alkaline Phos 38 - 126 U/L 123  AST 15 - 41 U/L 25  ALT 0 - 44 U/L 12    Discharge instruction: per After Visit Summary and "Baby  and Me Booklet".  After visit meds:  Allergies as of 02/09/2019      Reactions   Banana Itching      Medication List    TAKE these medications   ferrous sulfate 325 (65 FE) MG tablet Commonly known as:  FerrouSul Take 1 tablet (325 mg total) by mouth 2 (two) times daily. What changed:  Another medication with the same name was added. Make sure you understand how and when to take each.   ferrous sulfate 325 (65 FE) MG tablet Take 1 tablet (325 mg total) by mouth 2 (two) times daily with a meal. What changed:  You were already taking a medication with the same name, and this prescription was added. Make sure you understand how and when to take each.   ibuprofen 800 MG tablet Commonly known as:  ADVIL Take 1 tablet (800 mg total) by mouth every 6 (six) hours.   oxyCODONE 5 MG immediate release tablet Commonly known as:  Oxy IR/ROXICODONE Take 1-2 tablets (5-10 mg total) by mouth every 6 (six) hours as needed for moderate pain.   Vitafol Gummies 3.33-0.333-34.8 MG Chew Chew 3 each by mouth daily.       Diet: routine  diet  Activity: Advance as tolerated. Pelvic rest for 6 weeks.   Outpatient follow up:2 weeks Follow up Appt: Future Appointments  Date Time Provider Department Center  02/23/2019  3:30 PM [facility] NURSE [facility] None  03/24/2019  3:30 PM Nugent, Odie SeraNicole E, NP [facility] None   Follow up Visit: Follow-up Information    [facility]. Go in 2 week(s).   Specialty:  Obstetrics and Gynecology Why:  For incision check and blood pressure check  Contact information: [address] [place] [id number] [phone]           Please schedule this patient for Postpartum visit in: 6 weeks with the following provider: Any provider For C/S patients schedule nurse incision check in weeks 2 weeks: yes Low risk pregnancy complicated by: None Delivery mode:  CS Anticipated Birth Control:  IUD placed at time of C-section PP Procedures needed: Incision check, BP check Schedule Integrated BH visit: no    Newborn Data: Live born female  Birth Weight:   APGAR: 1, 7  Newborn Delivery   Birth date/time:  02/07/2019 00:50:00 Delivery type:  C-Section, Low Transverse Trial of labor:  No C-section categorization:  Primary     Baby Feeding: Bottle and Breast Disposition:home with mother   02/09/2019 Venia CarbonJennifer Lyfe Reihl, NP

## 2019-02-07 NOTE — Transfer of Care (Signed)
Immediate Anesthesia Transfer of Care Note  Patient: Mia Mccoy  Procedure(s) Performed: CESAREAN SECTION (N/A )  Patient Location: PACU  Anesthesia Type:Epidural  Level of Consciousness: awake, alert  and patient cooperative  Airway & Oxygen Therapy: Patient Spontanous Breathing  Post-op Assessment: Report given to RN and Post -op Vital signs reviewed and stable  Post vital signs: Reviewed and stable  Last Vitals:  Vitals Value Taken Time  BP    Temp    Pulse    Resp    SpO2      Last Pain:  Vitals:   02/07/19 0000  TempSrc: Axillary  PainSc:          Complications: No apparent anesthesia complications

## 2019-02-08 LAB — CBC
HCT: 22.2 % — ABNORMAL LOW (ref 36.0–46.0)
Hemoglobin: 7.1 g/dL — ABNORMAL LOW (ref 12.0–15.0)
MCH: 24.9 pg — ABNORMAL LOW (ref 26.0–34.0)
MCHC: 32 g/dL (ref 30.0–36.0)
MCV: 77.9 fL — ABNORMAL LOW (ref 80.0–100.0)
Platelets: 146 10*3/uL — ABNORMAL LOW (ref 150–400)
RBC: 2.85 MIL/uL — ABNORMAL LOW (ref 3.87–5.11)
RDW: 15.8 % — ABNORMAL HIGH (ref 11.5–15.5)
WBC: 13.8 10*3/uL — ABNORMAL HIGH (ref 4.0–10.5)
nRBC: 0 % (ref 0.0–0.2)

## 2019-02-08 MED ORDER — SODIUM CHLORIDE 0.9 % IV SOLN
510.0000 mg | Freq: Once | INTRAVENOUS | Status: AC
  Administered 2019-02-08: 14:00:00 510 mg via INTRAVENOUS
  Filled 2019-02-08: qty 17

## 2019-02-08 NOTE — Progress Notes (Signed)
Post Partum Day 1 Subjective: no complaints, up ad lib, voiding and tolerating PO  Objective: Blood pressure 110/72, pulse 94, temperature 98.6 F (37 C), temperature source Oral, resp. rate 18, height 4\' 11"  (1.499 m), weight 95.7 kg, last menstrual period 05/03/2018, SpO2 99 %, unknown if currently breastfeeding.  Physical Exam:  General: alert and cooperative Lochia: appropriate Uterine Fundus: firm Incision: no significant drainage DVT Evaluation: No evidence of DVT seen on physical exam. No cords or calf tenderness. No significant calf/ankle edema.  Recent Labs    02/07/19 1226 02/08/19 0608  HGB 7.7* 7.1*  HCT 23.9* 22.2*    Assessment/Plan: Plan for discharge tomorrow, Breastfeeding and Contraception PP IUD placed  Symptomatic PP anemia - Fereheme ordered today  Normotensive today    LOS: 2 days   Vonzella Nipple 02/08/2019, 10:36 AM

## 2019-02-08 NOTE — Lactation Note (Signed)
This note was copied from a baby's chart. Lactation Consultation Note  Patient Name: Mia Mccoy [id number] Date: 02/08/2019 Reason for consult: Follow-up assessment;Difficult latch;Term  P1 mother whose infant is now 75 hours old.    Mother was holding baby in her arms when I arrived.  RN in room and informed me that she offered to help with breast feeding earlier and mother declined.  Mother's desire is to pump and bottle feed.  Mother has the DEBP set up in her room and has not pumped at all today.  I offered to assist and mother declined.  She has been Educated about pumping and has not followed through. [facility] consult from 02/07/19)  I would question the sincere desire to pump.  Offered to return for the next feeding if mother desires assistance.  She verbalized understanding.     Maternal Data Formula Feeding for Exclusion: Yes Reason for exclusion: Mother's choice to formula and breast feed on admission Has patient been taught Hand Expression?: Yes Does the patient have breastfeeding experience prior to this delivery?: No  Feeding    LATCH Score                   Interventions    Lactation Tools Discussed/Used     Consult Status Consult Status: Follow-up Date: 02/09/19 Follow-up type: In-patient    [name] 02/08/2019, 2:21 PM

## 2019-02-08 NOTE — Clinical Social Work Maternal (Signed)
CLINICAL SOCIAL WORK MATERNAL/CHILD NOTE  Patient Details  Name: Mia Mccoy MRN: [id number] Date of Birth: 02/07/2020  Date:  02/08/2019  Clinical Social Worker Initiating Note:  Hortencia PilarKIerra Laticia Vannostrand, Theresia MajorsLCSWA  Date/Time: Initiated:  02/08/19/0900     Child's Name:  Mia Mccoy    Biological Parents:  Mother, Father(Mia Mccoy (MOB), Mia Mccoy (FOB) )   Need for Interpreter:  None   Reason for Referral:  Current Substance Use/Substance Use During Pregnancy    Address:  [address] [place] [id number]    Phone number:  [phone] (home)     Additional phone number: none   Household Members/Support Persons (HM/SP):   Household Member/Support Person 3, Household Member/Support Person 2   HM/SP Name Relationship DOB or Age  HM/SP -1       HM/SP -2 Abran CantorJanecia Starner (MOB)  MOB  11-24-1994  HM/SP -3 Kathlynn GrateNoah Jones (MOB's nephew)  Nephew   5  HM/SP -4   [name] (MOB's sister)   sister   [id number]  HM/SP -5   [name] (MOB's nephew)   nephew   [id number]  HM/SP -6   [name] (MOB's niece)   niece   4  HM/SP -7        HM/SP -8          Natural Supports (not living in the home):  Extended Family   Professional Supports: None   Employment: Other (comment)(on maternity leave from [employer]. )   Type of Work: FirefighterGreat Stops    Education:  [employer] school graduate   Homebound arranged:  n/a  Surveyor, quantityinancial Resources:  Medicaid   Other Resources:  AllstateWIC, Sales executiveood Stamps (plans to apply for both. )   Cultural/Religious Considerations Which May Impact Care:  none reported   Strengths:  Compliance with medical plan , Ability to meet basic needs , Home prepared for child , Pediatrician chosen   Psychotropic Medications:         [place]  Pediatrician List:   [facility]  [place]    [place]    [place]    [place]    [place]      Pediatrician Fax Number:    Risk  Factors/Current Problems:  None   Cognitive State:  Able to Concentrate , Alert , Insightful , Goal Oriented    Mood/Affect:  Bright , Calm , Comfortable , Happy    CSW Assessment: CSW consulted as MOB used THC during pregnancy. CSW went to speak with MOB at bedside to address further needs.   Upon entering the room CSW observed that MOB was lying in bed holding infant. CSW congratulated MOB on the birth of infant as well as explained to MOB the reason for visit. MOB expressed that she did use THC earlier on in her pregnancy with last use being about 4-5 months ago. CSW verbalized understanding but also explained to MOB the hospital drug screen policy. CSW advised MOB that at this time infants UDS is negative however CSW must monitor the cord drug drug and if infants CDS comes back positive for any substance that MOB was prescribed or given while in the hospital then a CPS report would need to be made. MOB understanding and expressed that she did not take any other medications while pregnant.   CSW spoke with MOB regarding mental health history. MOB denies having any mental health diagnosis. MOB expressed that has support  from Fob, her sister, and FOB's family. MOB expressed that she is on maternity leave at this time from [place] [employer] however is not getting WIC or Food Stamps. CSW asked MOB if she planned to apply and MOB ensured CSW that she would. MOB reports that she finished high school.   CSW spoke with MOB regarding PPD and SIDS. MOB knowledgeable about both topics however CSW still provided further detailed education on both topics. MOB appeared to be engaged while CSW spoke about both topics with her. MOB reports that there are no transportation barriers to getting her or infant to follow up appointments. MOB denies feeling SI or HI and reports that she is just sore. CSW understandings and reiterated drug screen policy to MOB foe an opportunity to ask questions-MOB denies having  questions at this time.   CSW Plan/Description:  No Further Intervention Required/No Barriers to Discharge, CSW Will Continue to Monitor Umbilical Cord Tissue Drug Screen Results and Make Report if [facility] Drug Screen Policy Information, Sudden Infant Death Syndrome (SIDS) Education    Robb Matar, LCSWA 02/08/2019, 10:02 AM

## 2019-02-09 ENCOUNTER — Encounter: Payer: Medicaid Other | Admitting: Obstetrics and Gynecology

## 2019-02-09 MED ORDER — OXYCODONE HCL 5 MG PO TABS: 5.0000 mg | ORAL_TABLET | Freq: Four times a day (QID) | ORAL | 0 refills | Status: DC | PRN

## 2019-02-09 MED ORDER — IBUPROFEN 800 MG PO TABS: 800.0000 mg | ORAL_TABLET | Freq: Four times a day (QID) | ORAL | 0 refills | Status: DC

## 2019-02-09 MED ORDER — FERROUS SULFATE 325 (65 FE) MG PO TABS: 325.0000 mg | ORAL_TABLET | Freq: Two times a day (BID) | ORAL | 3 refills | Status: DC

## 2019-02-09 NOTE — Discharge Instructions (Signed)
Anemia  Anemia is a condition in which you do not have enough red blood cells or hemoglobin. Hemoglobin is a substance in red blood cells that carries oxygen. When you do not have enough red blood cells or hemoglobin (are anemic), your body cannot get enough oxygen and your organs may not work properly. As a result, you may feel very tired or have other problems. What are the causes? Common causes of anemia include:  Excessive bleeding. Anemia can be caused by excessive bleeding inside or outside the body, including bleeding from the intestine or from periods in women.  Poor nutrition.  Long-lasting (chronic) kidney, thyroid, and liver disease.  Bone marrow disorders.  Cancer and treatments for cancer.  HIV (human immunodeficiency virus) and AIDS (acquired immunodeficiency syndrome).  Treatments for HIV and AIDS.  Spleen problems.  Blood disorders.  Infections, medicines, and autoimmune disorders that destroy red blood cells. What are the signs or symptoms? Symptoms of this condition include:  Minor weakness.  Dizziness.  Headache.  Feeling heartbeats that are irregular or faster than normal (palpitations).  Shortness of breath, especially with exercise.  Paleness.  Cold sensitivity.  Indigestion.  Nausea.  Difficulty sleeping.  Difficulty concentrating. Symptoms may occur suddenly or develop slowly. If your anemia is mild, you may not have symptoms. How is this diagnosed? This condition is diagnosed based on:  Blood tests.  Your medical history.  A physical exam.  Bone marrow biopsy. Your health care provider may also check your stool (feces) for blood and may do additional testing to look for the cause of your bleeding. You may also have other tests, including:  Imaging tests, such as a CT scan or MRI.  Endoscopy.  Colonoscopy. How is this treated? Treatment for this condition depends on the cause. If you continue to lose a lot of blood, you may  need to be treated at a hospital. Treatment may include:  Taking supplements of iron, vitamin M08, or folic acid.  Taking a hormone medicine (erythropoietin) that can help to stimulate red blood cell growth.  Having a blood transfusion. This may be needed if you lose a lot of blood.  Making changes to your diet.  Having surgery to remove your spleen. Follow these instructions at home:  Take over-the-counter and prescription medicines only as told by your health care provider.  Take supplements only as told by your health care provider.  Follow any diet instructions that you were given.  Keep all follow-up visits as told by your health care provider. This is important. Contact a health care provider if:  You develop new bleeding anywhere in the body. Get help right away if:  You are very weak.  You are short of breath.  You have pain in your abdomen or chest.  You are dizzy or feel faint.  You have trouble concentrating.  You have bloody or black, tarry stools.  You vomit repeatedly or you vomit up blood. Summary  Anemia is a condition in which you do not have enough red blood cells or enough of a substance in your red blood cells that carries oxygen (hemoglobin).  Symptoms may occur suddenly or develop slowly.  If your anemia is mild, you may not have symptoms.  This condition is diagnosed with blood tests as well as a medical history and physical exam. Other tests may be needed.  Treatment for this condition depends on the cause of the anemia. This information is not intended to replace advice given to you by  your health care provider. Make sure you discuss any questions you have with your health care provider. °Document Released: 10/02/2004 Document Revised: 09/26/2016 Document Reviewed: 09/26/2016 °Elsevier Interactive Patient Education © 2019 [employer]vier Inc[employer] ° °

## 2019-02-09 NOTE — Anesthesia Postprocedure Evaluation (Signed)
Anesthesia Post Note  Patient: Mia Mccoy  Procedure(s) Performed: CESAREAN SECTION (Bilateral )     Patient location during evaluation: PACU Anesthesia Type: Epidural Level of consciousness: oriented and awake and alert Pain management: pain level controlled Vital Signs Assessment: post-procedure vital signs reviewed and stable Respiratory status: spontaneous breathing, respiratory function stable and patient connected to nasal cannula oxygen Cardiovascular status: blood pressure returned to baseline and stable Postop Assessment: no headache, no backache and no apparent nausea or vomiting Anesthetic complications: no    Last Vitals:  Vitals:   02/08/19 2115 02/09/19 0552  BP: 128/62 105/84  Pulse: 82 76  Resp: 18 18  Temp: 37 C 37 C  SpO2:      Last Pain:  Vitals:   02/09/19 0552  TempSrc: Oral  PainSc: 0-No pain                 Prosperity Darrough S

## 2019-02-09 NOTE — Lactation Note (Signed)
This note was copied from a baby's chart. Lactation Consultation Note  Patient Name: Girl Tarana Bocanegra ENIDP'O Date: 02/09/2019 Reason for consult: Follow-up assessment;Term;Primapara;1st time breastfeeding  P1 mother whose infant is now 47 hours old.  Mother continues to have no desire to latch baby.  When I questioned her this morning about pumping and bottle feeding (which was her desire yesterday) she informed me that she is "not getting anything" from her breasts.  However, she also has not been pumping like we have discussed since admission.  In speaking with her I reviewed , again, the importance of pumping frequently to obtain a full milk supply.  It is my belief that mother is really not interested in pumping her milk.  She gives me the impression that she will bottle feed at home.  She has been given many opportunities over the last two days for help with latching and pumping.  She has not shown a sincere desire to do either.  Mother verbalized understanding.  She has our OP phone number for questions/concerns after discharge.  Offered to observe her pump now and she declined.  NP aware.   Maternal Data Formula Feeding for Exclusion: Yes Reason for exclusion: Mother's choice to formula and breast feed on admission Has patient been taught Hand Expression?: Yes Does the patient have breastfeeding experience prior to this delivery?: No  Feeding Feeding Type: Bottle Fed - Formula Nipple Type: Slow - flow  LATCH Score                   Interventions    Lactation Tools Discussed/Used Breast pump type: Double-Electric Breast Pump   Consult Status Consult Status: Complete Date: 02/09/19 Follow-up type: Call as needed    Cybele Maule R [place] 02/09/2019, 12:11 PM

## 2019-02-10 ENCOUNTER — Encounter: Payer: Medicaid Other | Admitting: Obstetrics and Gynecology

## 2019-02-23 ENCOUNTER — Telehealth (INDEPENDENT_AMBULATORY_CARE_PROVIDER_SITE_OTHER): Payer: Medicaid Other | Admitting: *Deleted

## 2019-02-23 DIAGNOSIS — Z3400 Encounter for supervision of normal first pregnancy, unspecified trimester: Secondary | ICD-10-CM

## 2019-02-23 MED ORDER — VITAFOL GUMMIES 3.33-0.333-34.8 MG PO CHEW: 3.0000 | CHEWABLE_TABLET | Freq: Every day | ORAL | 6 refills | Status: DC

## 2019-02-23 NOTE — Progress Notes (Signed)
    Virtual Visit via Telephone Note  I connected with Mia Mccoy on 02/24/19 at  3:30 PM EDT by telephone and verified that I am speaking with the correct person using two identifiers.  Location: Patient: Mia Mccoy MNR [id number] Provider: Derl Barrow, RN   I discussed the limitations, risks, security and privacy concerns of performing an evaluation and management service by telephone and the availability of in person appointments. I also discussed with the patient that there may be a patient responsible charge related to this service. The patient expressed understanding and agreed to proceed.   History of Present Illness: Subjective:     Mia Mccoy is a 23 y.o. female who presents to the clinic 2 weeks status post c-section for birth of infant. Eating a regular diet with difficulty. Bowel movements are normal. The patient is not having any pain.  The following portions of the patient's history were reviewed and updated as appropriate: allergies and current medications.  Review of Systems Pertinent items are noted in HPI.    Observations/Objective: BP 113/70 (BP Location: Left Arm, Patient Position: Sitting, Cuff Size: Normal)   Pulse 77   Ht 4\' 11"  (1.499 m)   Breastfeeding Yes Comment: Pumping  BMI 42.60 kg/m  General:  cooperative and Non-face to face  Abdomen: Non-face to face  Incision:   healing well, no drainage, no erythema, no hernia, no seroma, no swelling, Patient sent a Mychart pic of incision, no dehiscence, incision well approximated   Assessment and Plan: Doing well postoperatively.  1. Continue any current medications. 2. Wound care discussed. 3. Activity restrictions: no lifting more than 25 pounds 4. Anticipated return to work: not applicable. 5. Follow up: 4 weeks for Postpartum  Follow Up Instructions:  I discussed the assessment and treatment plan with the patient. The patient was provided an opportunity to ask questions and all  were answered. The patient agreed with the plan and demonstrated an understanding of the instructions.   The patient was advised to call back or seek an in-person evaluation if the symptoms worsen or if the condition fails to improve as anticipated.  I provided 15 minutes of non-face-to-face time during this encounter.   Derl Barrow, RN

## 2019-03-24 ENCOUNTER — Telehealth (INDEPENDENT_AMBULATORY_CARE_PROVIDER_SITE_OTHER): Payer: Medicaid Other | Admitting: *Deleted

## 2019-03-24 NOTE — Progress Notes (Signed)
Patient rescheduled postpartum appt for tomorrow 03/25/2019.  Derl Barrow, RN

## 2019-03-25 ENCOUNTER — Other Ambulatory Visit: Payer: Self-pay

## 2019-03-25 ENCOUNTER — Telehealth (INDEPENDENT_AMBULATORY_CARE_PROVIDER_SITE_OTHER): Payer: Medicaid Other | Admitting: Family

## 2019-03-25 ENCOUNTER — Encounter: Payer: Self-pay | Admitting: General Practice

## 2019-03-25 DIAGNOSIS — Z1389 Encounter for screening for other disorder: Secondary | ICD-10-CM | POA: Diagnosis not present

## 2019-03-25 NOTE — Progress Notes (Signed)
   TELEHEALTH VIRTUAL POSTPARTUM VISIT ENCOUNTER NOTE  I connected with Mia Mccoy on 03/25/19 at 11:10 AM EDT by telephone at home and verified that I am speaking with the correct person using two identifiers.   I discussed the limitations, risks, security and privacy concerns of performing an evaluation and management service by telephone and the availability of in person appointments. I also discussed with the patient that there may be a patient responsible charge related to this service. The patient expressed understanding and agreed to proceed.   History:  Mia Mccoy is a 23 y.o. G68P1001 female who presents for a postpartum visit. She is 6 weeks postpartum following a c-section delivery. I have fully reviewed the prenatal and intrapartum course. The delivery was at 40 gestational weeks.  Anesthesia: epidural. Postpartum course has been unremarkable. Able to return to work. Reports csection incision site healing well with no redness, abnormal discharge or odor.  Baby's course has been unremarkable, doing well. Baby is feeding by formula: Gerber Soy. Bleeding: LMP around 03/15/2019. Bowel function is normal. Bladder function is normal. Patient is not currently sexually active. Contraception method choice is IUD placed after delivery. Postpartum depression screening: negative: score 2.   +support in the home.     Past Medical History:  Diagnosis Date  . Medical history non-contributory   . Thyroid disorder    Past Surgical History:  Procedure Laterality Date  . CESAREAN SECTION Bilateral 02/07/2019   Procedure: CESAREAN SECTION;  Surgeon: Donnamae Jude, MD;  Location: [facility];  Service: Obstetrics;  Laterality: Bilateral;  . NO PAST SURGERIES    . WISDOM TOOTH EXTRACTION     The following portions of the patient's history were reviewed and updated as appropriate: allergies, current medications, past family history, past medical history, past social history, past surgical history  and problem list.   Health Maintenance:  Normal pap and negative 08/2018.    Review of Systems:  Pertinent items noted in HPI and remainder of comprehensive ROS otherwise negative.  Physical Exam:  Physical exam deferred due to nature of the encounter  Pt will set-up virtual visit in one week to review csection incision and do blood pressure; in public at time of visit and unable to show.  Labs and Imaging No results found for this or any previous visit (from the past 336 hour(s)). No results found.    Assessment and Plan:   Postpartum Visit      I discussed the assessment and treatment plan with the patient. The patient was provided an opportunity to ask questions and all were answered. The patient agreed with the plan and demonstrated an understanding of the instructions.   The patient was advised to call back or seek an in-person evaluation/go to the ED if the symptoms worsen or if the condition fails to improve as anticipated.  I provided 15 minutes of non-face-to-face time during this encounter.   Cyndee Brightly, CNM

## 2019-03-28 ENCOUNTER — Telehealth: Payer: Self-pay | Admitting: General Practice

## 2019-03-28 NOTE — Telephone Encounter (Signed)
Pt aware of appt on Thursday, 03/31/2019 at 1:30pm with Carrolyn Leigh, CNM.  Pt verbalized understanding.

## 2019-03-31 ENCOUNTER — Telehealth (INDEPENDENT_AMBULATORY_CARE_PROVIDER_SITE_OTHER): Payer: Medicaid Other | Admitting: Family

## 2019-03-31 DIAGNOSIS — Z09 Encounter for follow-up examination after completed treatment for conditions other than malignant neoplasm: Secondary | ICD-10-CM | POA: Diagnosis not present

## 2019-03-31 NOTE — Progress Notes (Signed)
I connected with  [name] on 03/31/19 at  4:00 PM EDT by telephone and verified that I am speaking with the correct person using two identifiers.   I discussed the limitations, risks, security and privacy concerns of performing an evaluation and management service by telephone and the availability of in person appointments. I also discussed with the patient that there may be a patient responsible charge related to this service. The patient expressed understanding and agreed to proceed.   Appt scheduled to review csection incision and blood pressure that was not obtained on last telehealth visit.    S:  Pt reports incision continue to heals well.  No additional problem or concerns.    O:  Blood pressure:  105/65 (remembering from yesterday; plans to send a MyChart note with blood pressure tomorrow.  Unable to connect to video.  Plans to send a pic via MyChart  A: Post op Follow-up  P:  BP and incision pic pending.  Cyndee Brightly, CNM 03/31/2019  4:19 PM

## 2019-04-27 IMAGING — US US OB < 14 WEEKS - US OB TV
1 series · 15 of 28 positions shown · non-contrast
Comparison: None.

CLINICAL DATA: Abdominal pain in pregnancy

EXAM:
OBSTETRIC <14 WK US AND TRANSVAGINAL OB US
TECHNIQUE: Both transabdominal and transvaginal ultrasound examinations were
performed for complete evaluation of the gestation as well as the
maternal uterus, adnexal regions, and pelvic cul-de-sac.
Transvaginal technique was performed to assess early pregnancy.

[Series 1: us ob < 14 weeks - us ob tv · 67 acquisitions, 15 frames shown]
[im 1/67]
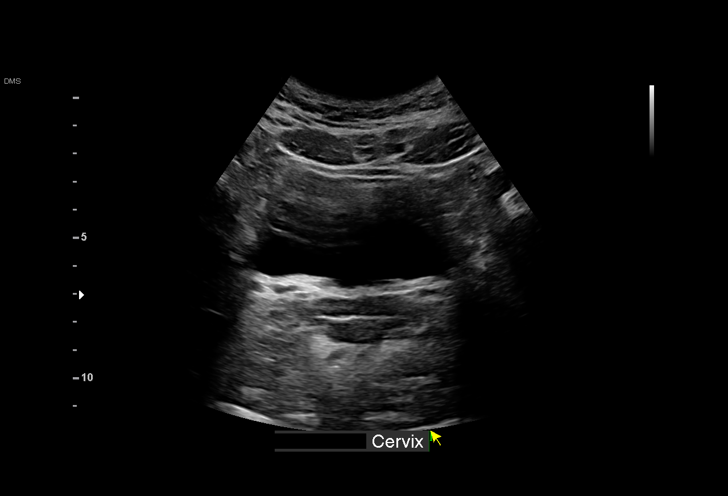
[im 5/67]
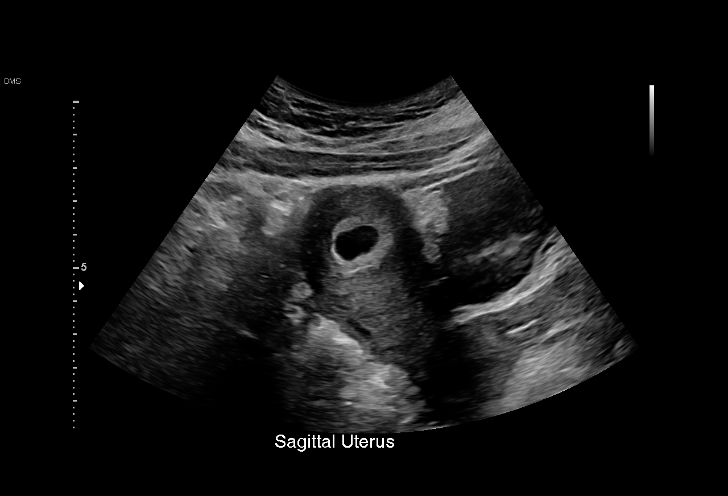
[im 10/67]
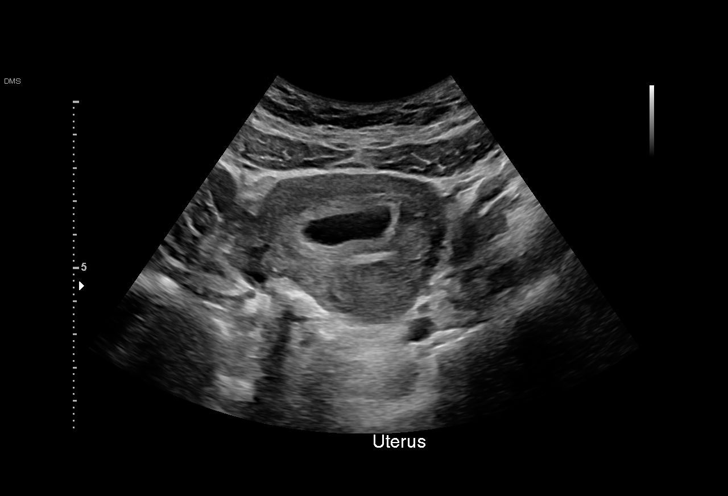
[im 15/67]
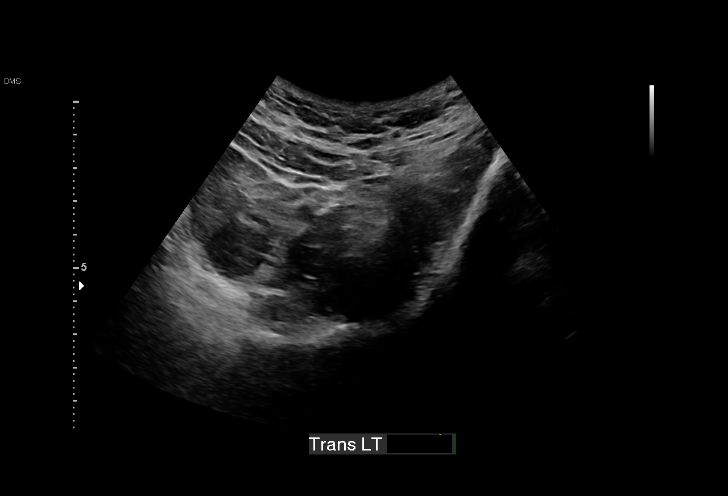
[im 20/67]
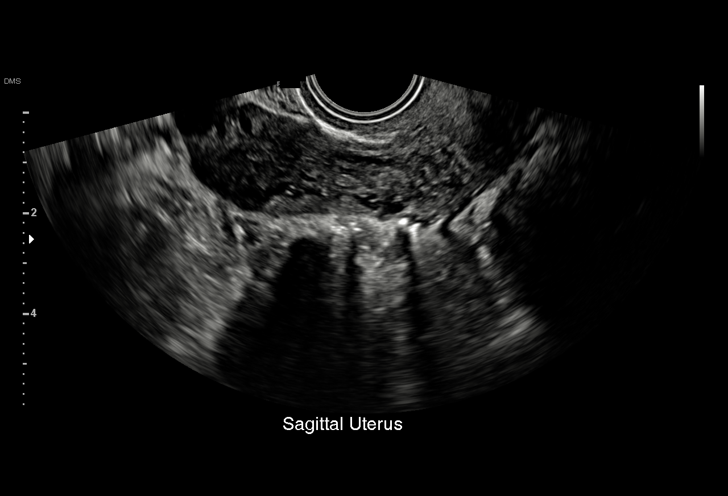
[im 25/67]
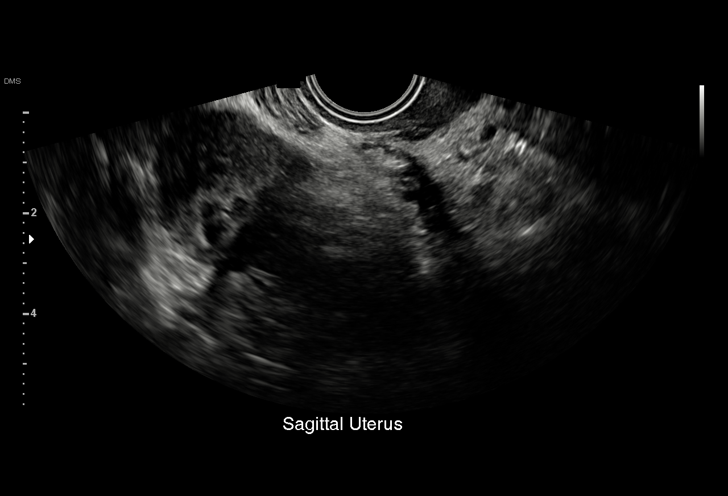
[im 30/67]
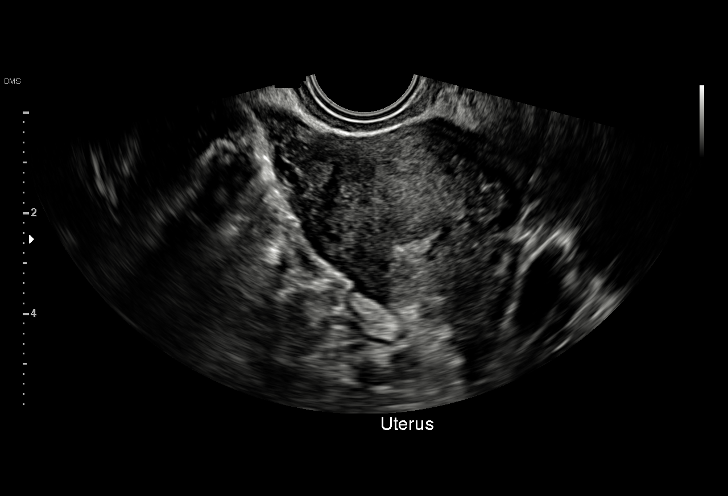
[im 35/67]
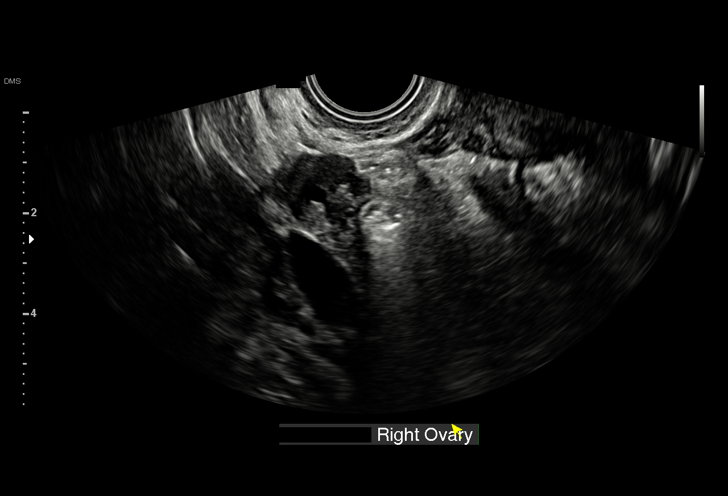
[im 37/67]
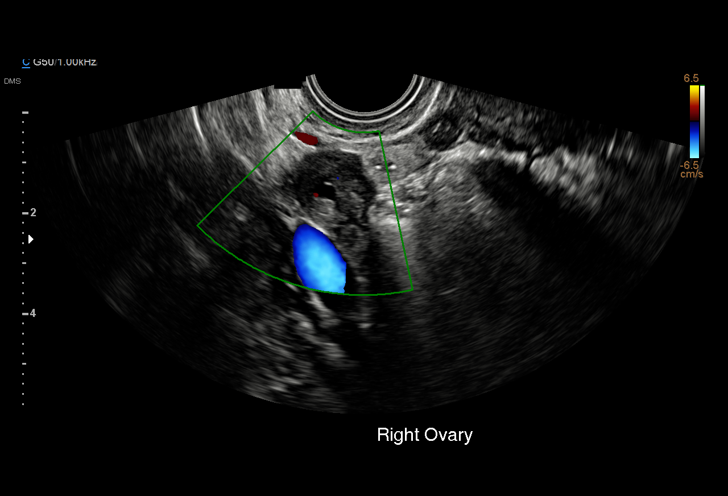
[im 42/67]
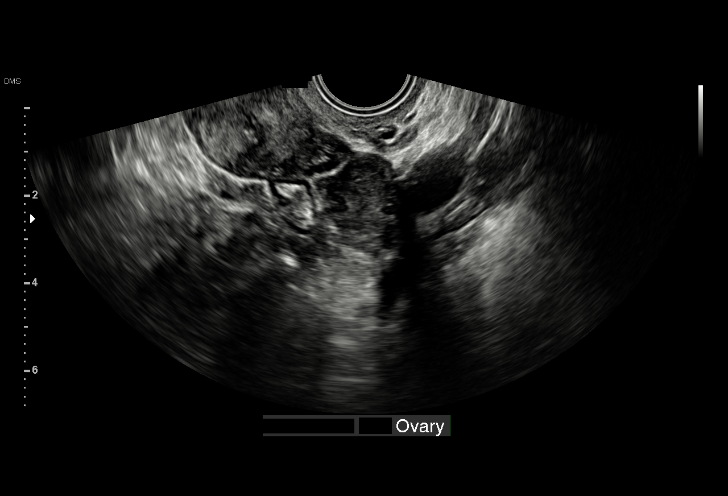
[im 47/67]
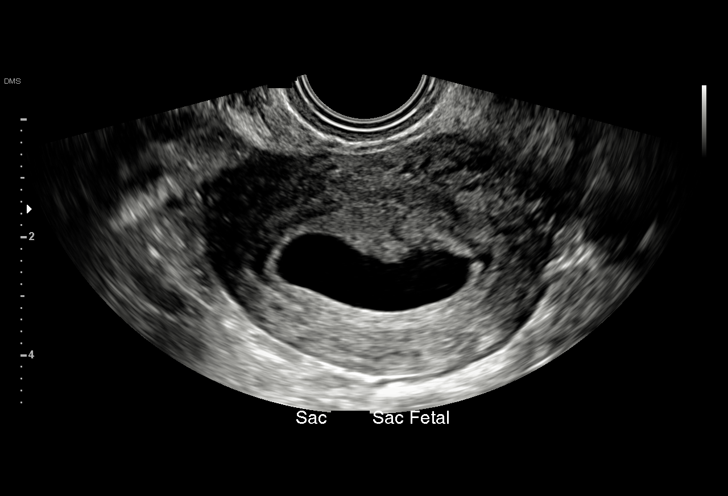
[im 52/67]
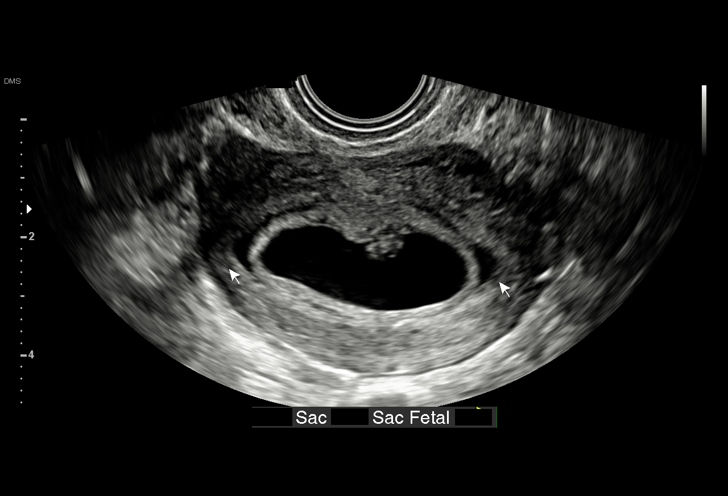
[im 57/67]
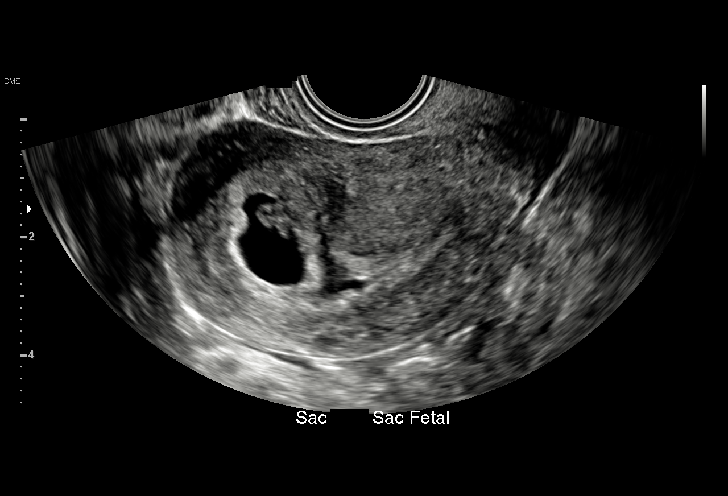
[im 62/67]
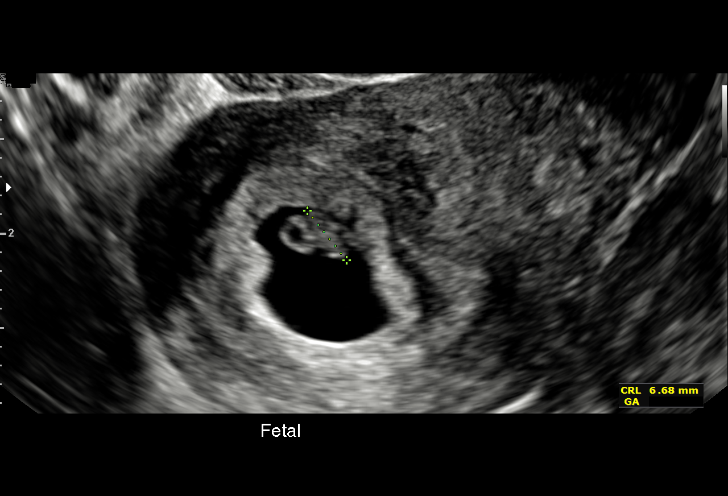
[im 67/67]
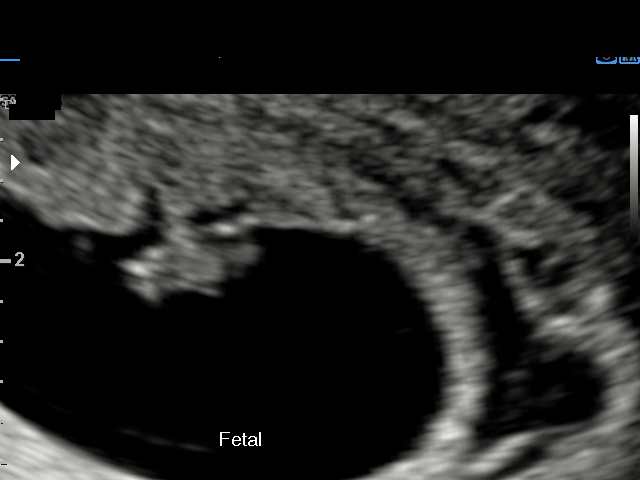

[15 of 28 positions shown; findings below may reference images not displayed]

FINDINGS: Intrauterine gestational sac: Single

Yolk sac:  Visualized

Embryo:  Visualized

Cardiac Activity: Visualized

Heart Rate: 169 bpm

MSD:   mm    w     d

CRL:  6.81 mm   6 w   3 d                  US EDC: 02/13/2019

Subchorionic hemorrhage:  Small subchorionic hemorrhage

Maternal uterus/adnexae: No adnexal mass.  Trace free fluid.
IMPRESSION: Six week 3 day intrauterine pregnancy. Fetal heart rate 169 beats
per minute. Small subchorionic hemorrhage.

## 2019-07-11 ENCOUNTER — Encounter: Payer: Self-pay | Admitting: Advanced Practice Midwife

## 2019-07-11 DIAGNOSIS — Z975 Presence of (intrauterine) contraceptive device: Secondary | ICD-10-CM | POA: Insufficient documentation

## 2019-11-21 IMAGING — US US MFM OB FOLLOW UP
1 series · 13 of 28 positions shown · non-contrast
Comparison: none

[Series 1: us mfm ob follow up · 45 acquisitions, 13 frames shown]
[im 2/45]
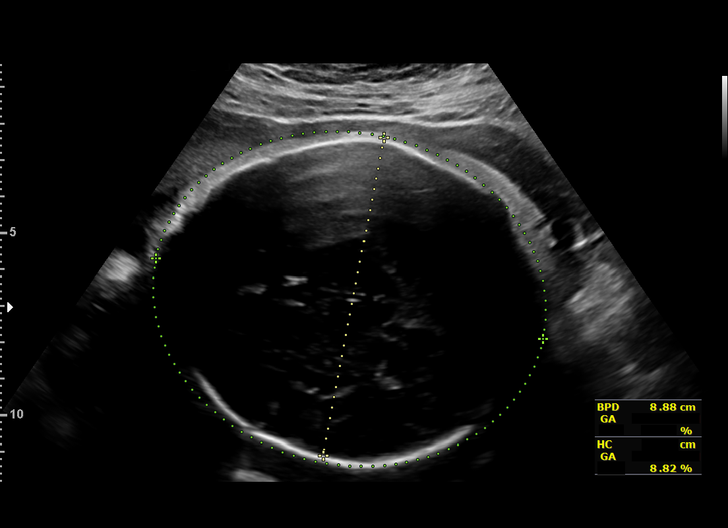
[im 5/45]
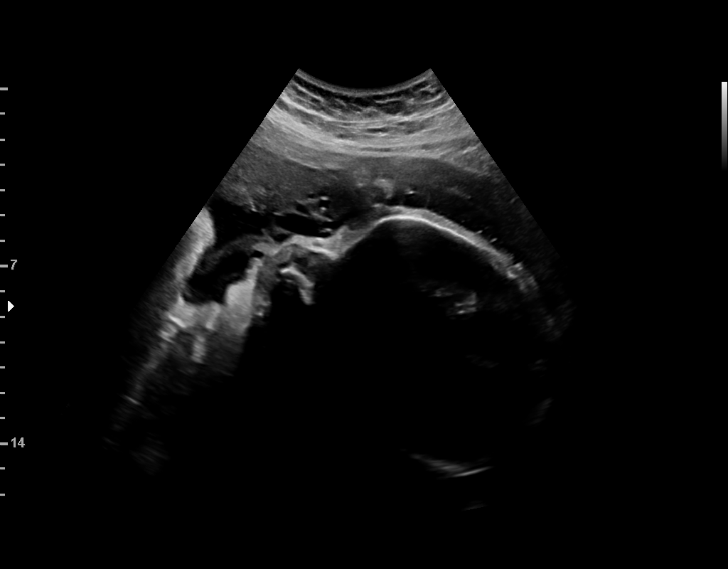
[im 9/45]
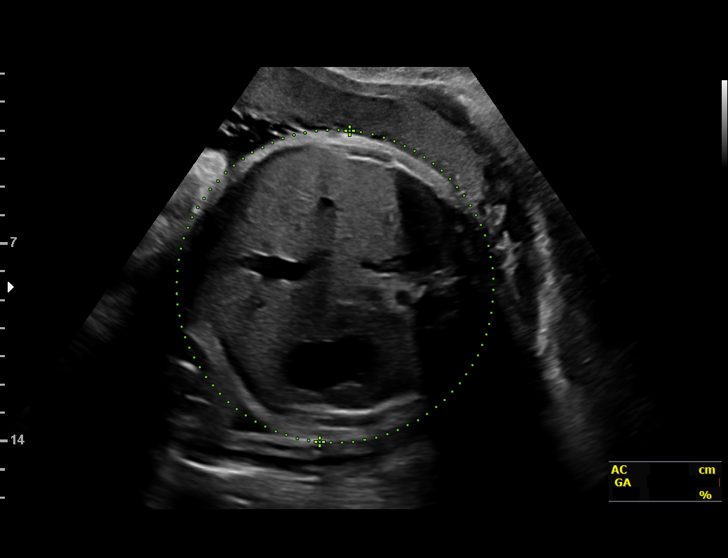
[im 12/45]
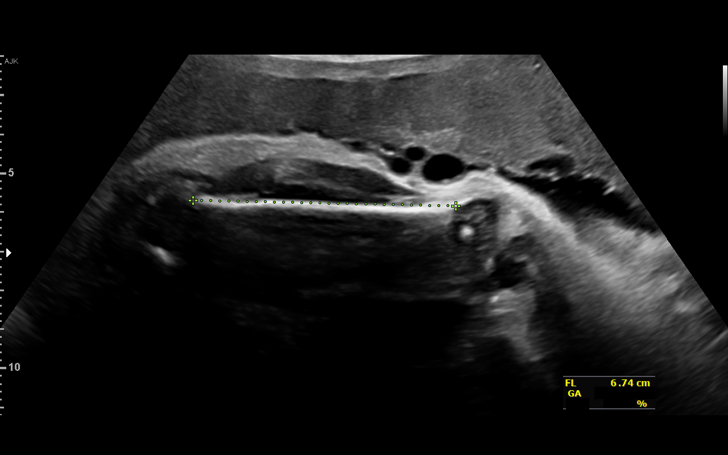
[im 15/45]
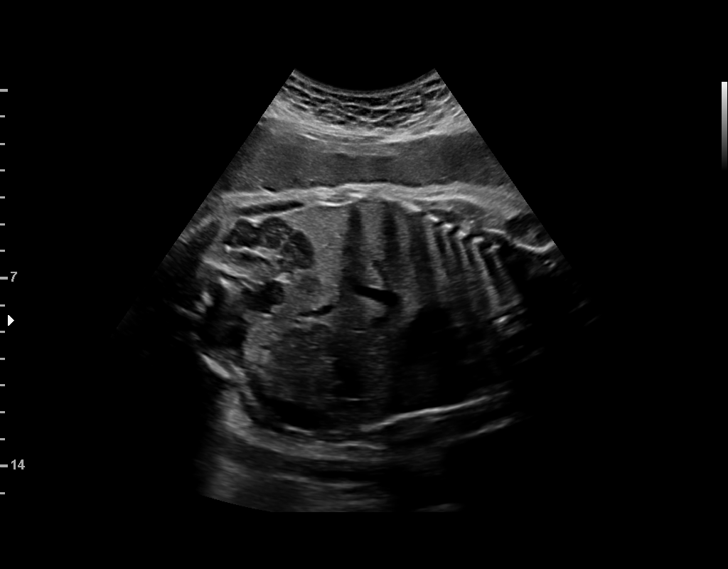
[im 18/45]
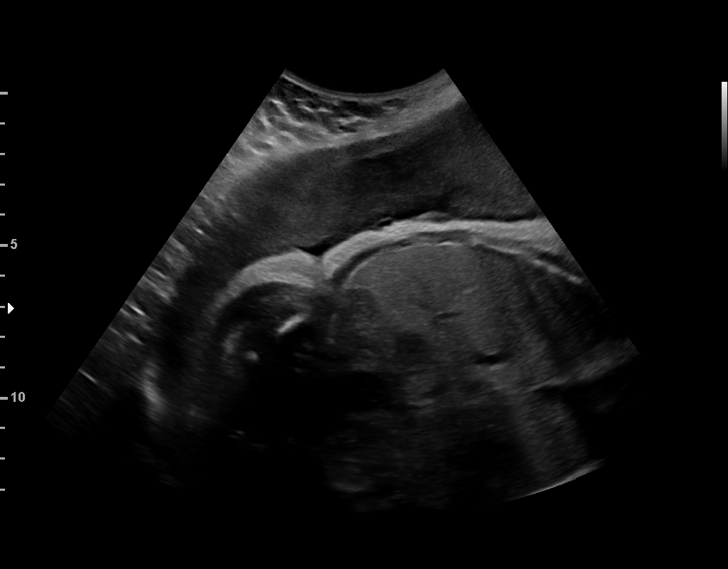
[im 23/45]
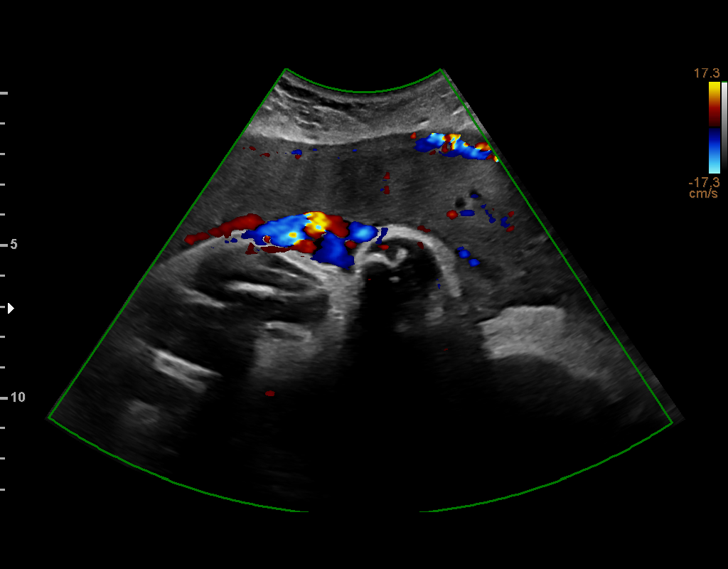
[im 27/45]
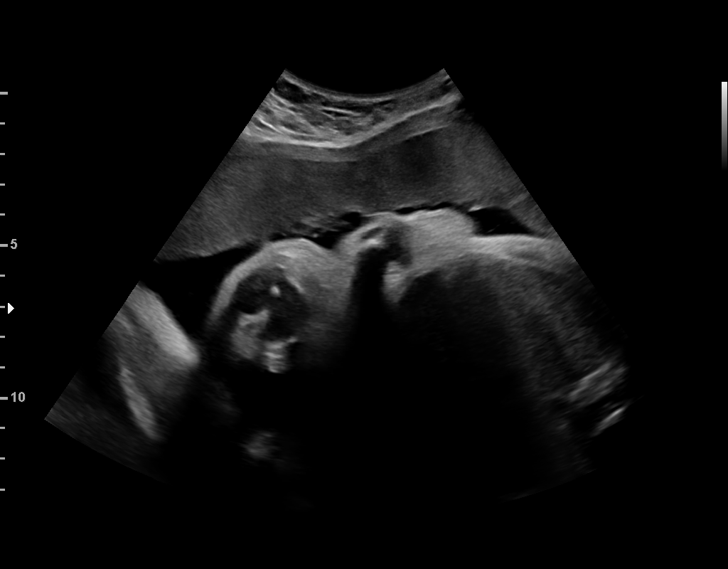
[im 30/45]
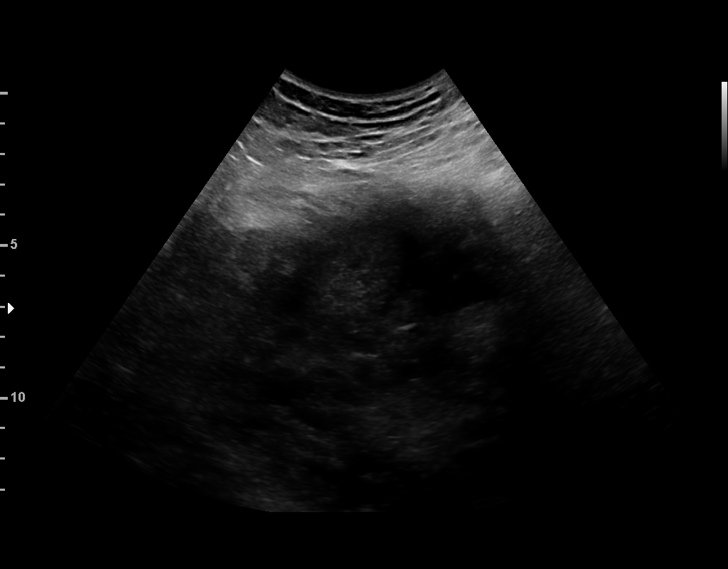
[im 33/45]
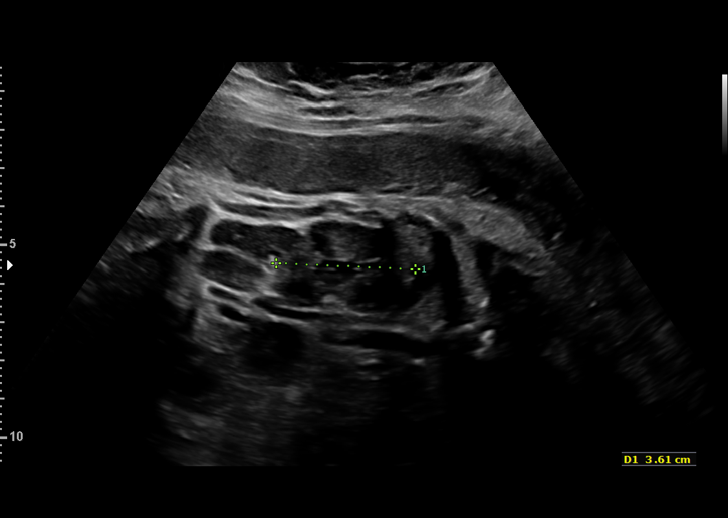
[im 36/45]
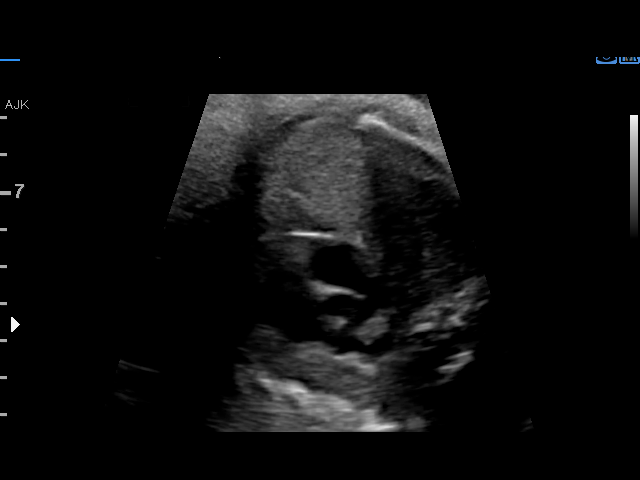
[im 40/45]
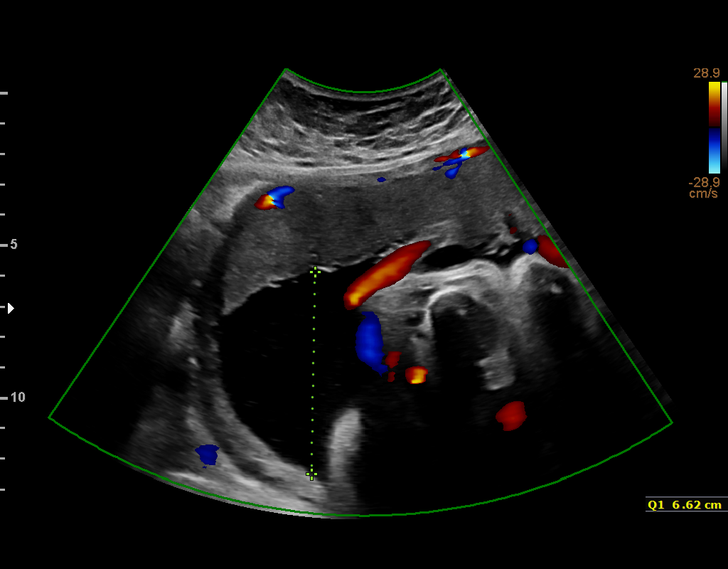
[im 43/45]
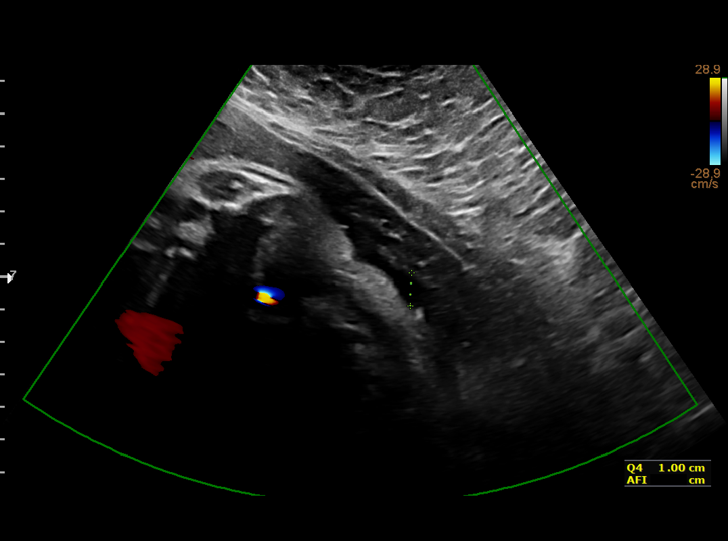

[13 of 28 positions shown; findings below may reference images not displayed]

----------------------------------------------------------------------

 ----------------------------------------------------------------------
Indications

  Marijuana Abuse
  Uterine size-date discrepancy, third trimester
  36 weeks gestation of pregnancy
 ----------------------------------------------------------------------
Vital Signs

 BMI:         41
Fetal Evaluation

 Num Of Fetuses:         1
 Cardiac Activity:       Observed
 Presentation:           Cephalic
 Placenta:               Anterior
 P. Cord Insertion:      Visualized

 Amniotic Fluid
 AFI FV:      Within normal limits

 AFI Sum(cm)     %Tile       Largest Pocket(cm)
 12.18           38

 RUQ(cm)       RLQ(cm)       LUQ(cm)        LLQ(cm)
 6.62          1
Biometry

 BPD:        89  mm     G. Age:  36w 0d         55  %    CI:        79.45   %    70 - 86
                                                         FL/HC:      21.1   %    20.1 -
 HC:      315.6  mm     G. Age:  35w 3d         10  %    HC/AC:      0.90        0.93 -
 AC:      349.3  mm     G. Age:  38w 6d       > 97  %    FL/BPD:     74.9   %    71 - 87
 FL:       66.7  mm     G. Age:  34w 2d          9  %    FL/AC:      19.1   %    20 - 24

 Est. FW:    6005  gm    6 lb 14 oz      81  %
Gestational Age

 LMP:           37w 0d        Date:  05/03/18                 EDD:   02/07/19
 U/S Today:     36w 1d                                        EDD:   02/13/19
 Best:          36w 1d     Det. By:  Early Ultrasound         EDD:   02/13/19
                                     (06/23/18)
Anatomy

 Cranium:               Appears normal         LVOT:                   Previously seen
 Cavum:                 Appears normal         Aortic Arch:            Previously seen
 Ventricles:            Appears normal         Ductal Arch:            Previously seen
 Choroid Plexus:        Previously seen        Diaphragm:              Appears normal
 Cerebellum:            Previously seen        Stomach:                Appears normal, left
                                                                       sided
 Posterior Fossa:       Previously seen        Abdomen:                Appears normal
 Nuchal Fold:           Previously seen        Abdominal Wall:         Previously seen
 Face:                  Orbits and profile     Cord Vessels:           Previously seen
                        previously seen
 Lips:                  Previously seen        Kidneys:                Appear normal
 Palate:                Previously seen        Bladder:                Appears normal
 Thoracic:              Appears normal         Spine:                  Previously seen
 Heart:                 Previously seen        Upper Extremities:      Previously seen
 RVOT:                  Previously seen        Lower Extremities:      Previously seen

 Other:  Technicallly difficult due to advanced GA and maternal habitus.
Impression

 Normal interval growth.
Recommendations

 Follow up as clinically indicated.

## 2020-01-31 ENCOUNTER — Encounter: Payer: Self-pay | Admitting: General Practice

## 2020-06-14 ENCOUNTER — Telehealth: Payer: Self-pay

## 2020-06-14 NOTE — Telephone Encounter (Signed)
Pt would like to have her birth control removed.   Copied from CRM [id number]. Topic: General - Other >> Jun 14, 2020  9:39 AM Dalphine Handing A wrote: Patient has upcoming appt for check up and also wants to get a referral to have birth control removed, however patient is wanting to know if its possible to be referred to an office that doesn't charge for the removal of birth control based on [place]

## 2020-06-15 NOTE — Telephone Encounter (Signed)
Called pt left message .

## 2020-06-28 ENCOUNTER — Other Ambulatory Visit: Payer: Self-pay

## 2020-06-28 ENCOUNTER — Ambulatory Visit: Payer: Medicaid Other | Attending: Family | Admitting: Family

## 2020-06-28 DIAGNOSIS — Z7689 Persons encountering health services in other specified circumstances: Secondary | ICD-10-CM

## 2020-06-28 DIAGNOSIS — Z30432 Encounter for removal of intrauterine contraceptive device: Secondary | ICD-10-CM

## 2020-06-28 NOTE — Patient Instructions (Signed)

## 2020-06-28 NOTE — Progress Notes (Signed)
Virtual Visit via Telephone Note  I connected with Nilam Quakenbush, on 06/28/2020 at 2:20 PM by telephone due to the COVID-19 pandemic and verified that I am speaking with the correct person using two identifiers.  Due to current restrictions/limitations of in-office visits due to the COVID-19 pandemic, this scheduled clinical appointment was converted to a telehealth visit.   Consent: I discussed the limitations, risks, security and privacy concerns of performing an evaluation and management service by telephone and the availability of in person appointments. I also discussed with the patient that there may be a patient responsible charge related to this service. The patient expressed understanding and agreed to proceed.  Location of Patient: Home  Location of Provider: [facility]  Persons participating in Telemedicine visit: Wendi Maya, NP Margorie John, CMA  History of Present Illness: Mia Mccoy is a 24 year-old female who presents today to establish care and birth control referral.  1. ESTABLISH CARE: PRESENT ILLNESS:   No present illnesses  Not taking any prescribed and over-the counter medications  Allergies: banana  Denies alcohol and drug use  PAST HISTORY:   Childhood illnesses: none  Medical history: thyroid disorder  Surgical history: Cesarean section 02/07/2019  Obstetric/Gynecology: 1 pregnancy, 1 birth  Psychiatric illnesses: denies  Health maintenance: no flu vaccine and no Covid vaccine  FAMILY HISTORY:   Father, brain cancer  Paternal grandmother, cancer   PERSONAL AND SOCIAL HISTORY:   Occupation: warehouse  Last year of schooling: high school   Home situation: her and her child  Denies stress  Exercise, denies  Diet, needs more balance    2. BIRTH CONTROL REMOVAL:  Had IUD placed 1 year ago shortly after giving birth in 2020. Requesting removal of IUD because causing cramps  also would like to have another child soon. Had IUD years ago when she was around 24 years old without complication. Last menstrual period: currently menstruating Menstrual period regularity: irregular   Menstrual period length: varies Menstrual period flow: varies  Pregnancy history: 1 Births: 1 Miscarriages: 0 Abortions: 0 Postpartum Status: 1 year  Lactation history: denies  Shortness of breath: denies  Chest pain: denies Smoking: denies  Any migraine with/without auras: sometimes has headaches Hypertension: denies Diabetes: denies Blood clots: denies Current prescriptions and medication use: none Contraceptive use in the past: IUD when 24 years old Contraceptive preference: none, would like to have a child soon Intention of pregnancy in the future: yes   Past Medical History:  Diagnosis Date   Medical history non-contributory    Thyroid disorder    Allergies  Allergen Reactions   Banana Itching    Current Outpatient Medications on File Prior to Visit  Medication Sig Dispense Refill   ferrous sulfate 325 (65 FE) MG tablet Take 1 tablet (325 mg total) by mouth 2 (two) times daily with a meal. 60 tablet 3   Prenatal Vit-Fe Phos-FA-Omega (VITAFOL GUMMIES) 3.33-0.333-34.8 MG CHEW Chew 3 each by mouth daily. 90 tablet 6   No current facility-administered medications on file prior to visit.    Observations/Objective: Alert and oriented x 3. Not in acute distress. Physical examination not completed as this is a telemedicine visit.  Assessment and Plan: 1. Encounter to establish care: - Patient presents to establish care today.  - Follow-up with primary physician in 1 month for physical examination and labs. - Make appointment sooner if needed.  2. Awaiting removal of contraceptive intrauterine device (IUD): - IUD in place. Causing cramps  and would like to have another child soon. Requesting removal.  - Referral to [facility] to make  appointment with Dr. Earlene Plater for IUD removal. Call [phone] to make appointment. Patient verbalized agreement.   Follow Up Instructions: Follow-up with primary physician in 1 month or sooner if needed.   Patient was given clear instructions to go to Emergency Department or return to medical center if symptoms don't improve, worsen, or new problems develop.The patient verbalized understanding.  I discussed the assessment and treatment plan with the patient. The patient was provided an opportunity to ask questions and all were answered. The patient agreed with the plan and demonstrated an understanding of the instructions.   The patient was advised to call back or seek an in-person evaluation if the symptoms worsen or if the condition fails to improve as anticipated.   I provided 20 minutes total of non-face-to-face time during this encounter including median intraservice time, reviewing previous notes, labs, imaging, medications, management and patient verbalized understanding.    Rema Fendt, NP  [facility] [place], [place] [phone]   06/28/2020, 8:22 AM

## 2020-07-24 ENCOUNTER — Other Ambulatory Visit: Payer: Self-pay

## 2020-07-24 ENCOUNTER — Other Ambulatory Visit (HOSPITAL_COMMUNITY)
Admission: RE | Admit: 2020-07-24 | Discharge: 2020-07-24 | Disposition: A | Discharge status: Home / Self Care | Payer: Medicaid Other | Source: Ambulatory Visit | Attending: Internal Medicine | Admitting: Internal Medicine

## 2020-07-24 ENCOUNTER — Ambulatory Visit: Payer: Medicaid Other | Attending: Internal Medicine | Admitting: Internal Medicine

## 2020-07-24 ENCOUNTER — Encounter: Payer: Self-pay | Admitting: Internal Medicine

## 2020-07-24 VITALS — BP 118/73 | HR 87 | Resp 16 | Ht 59.0 in | Wt 146.2 lb

## 2020-07-24 DIAGNOSIS — Z0000 Encounter for general adult medical examination without abnormal findings: Secondary | ICD-10-CM

## 2020-07-24 DIAGNOSIS — Z113 Encounter for screening for infections with a predominantly sexual mode of transmission: Secondary | ICD-10-CM | POA: Diagnosis not present

## 2020-07-24 DIAGNOSIS — Z862 Personal history of diseases of the blood and blood-forming organs and certain disorders involving the immune mechanism: Secondary | ICD-10-CM | POA: Diagnosis not present

## 2020-07-24 DIAGNOSIS — Z2821 Immunization not carried out because of patient refusal: Secondary | ICD-10-CM | POA: Insufficient documentation

## 2020-07-24 DIAGNOSIS — Z1159 Encounter for screening for other viral diseases: Secondary | ICD-10-CM | POA: Diagnosis not present

## 2020-07-24 DIAGNOSIS — E663 Overweight: Secondary | ICD-10-CM

## 2020-07-24 NOTE — Patient Instructions (Signed)
Healthy Eating °Following a healthy eating pattern may help you to achieve and maintain a healthy body weight, reduce the risk of chronic disease, and live a long and productive life. It is important to follow a healthy eating pattern at an appropriate calorie level for your body. Your nutritional needs should be met primarily through food by choosing a variety of nutrient-rich foods. °What are tips for following this plan? °Reading food labels °· Read labels and choose the following: °? Reduced or low sodium. °? Juices with 100% fruit juice. °? Foods with low saturated fats and high polyunsaturated and monounsaturated fats. °? Foods with whole grains, such as whole wheat, cracked wheat, brown rice, and wild rice. °? Whole grains that are fortified with folic acid. This is recommended for women who are pregnant or who want to become pregnant. °· Read labels and avoid the following: °? Foods with a lot of added sugars. These include foods that contain brown sugar, corn sweetener, corn syrup, dextrose, fructose, glucose, high-fructose corn syrup, honey, invert sugar, lactose, malt syrup, maltose, molasses, raw sugar, sucrose, trehalose, or turbinado sugar. °§ Do not eat more than the following amounts of added sugar per day: °§ 6 teaspoons (25 g) for women. °§ 9 teaspoons (38 g) for men. °? Foods that contain processed or refined starches and grains. °? Refined grain products, such as white flour, degermed cornmeal, white bread, and white rice. °Shopping °· Choose nutrient-rich snacks, such as vegetables, whole fruits, and nuts. Avoid high-calorie and high-sugar snacks, such as potato chips, fruit snacks, and candy. °· Use oil-based dressings and spreads on foods instead of solid fats such as butter, stick margarine, or cream cheese. °· Limit pre-made sauces, mixes, and "instant" products such as flavored rice, instant noodles, and ready-made pasta. °· Try more plant-protein sources, such as tofu, tempeh, black beans,  edamame, lentils, nuts, and seeds. °· Explore eating plans such as the Mediterranean diet or vegetarian diet. °Cooking °· Use oil to sauté or stir-fry foods instead of solid fats such as butter, stick margarine, or lard. °· Try baking, boiling, grilling, or broiling instead of frying. °· Remove the fatty part of meats before cooking. °· Steam vegetables in water or broth. °Meal planning ° °· At meals, imagine dividing your plate into fourths: °? One-half of your plate is fruits and vegetables. °? One-fourth of your plate is whole grains. °? One-fourth of your plate is protein, especially lean meats, poultry, eggs, tofu, beans, or nuts. °· Include low-fat dairy as part of your daily diet. °Lifestyle °· Choose healthy options in all settings, including home, work, school, restaurants, or stores. °· Prepare your food safely: °? Wash your hands after handling raw meats. °? Keep food preparation surfaces clean by regularly washing with hot, soapy water. °? Keep raw meats separate from ready-to-eat foods, such as fruits and vegetables. °? Cook seafood, meat, poultry, and eggs to the recommended internal temperature. °? Store foods at safe temperatures. In general: °§ Keep cold foods at 40°F (4.4°C) or below. °§ Keep hot foods at 140°F (60°C) or above. °§ Keep your freezer at 0°F (-17.8°C) or below. °§ Foods are no longer safe to eat when they have been between the temperatures of 40°-140°F (4.4-60°C) for more than 2 hours. °What foods should I eat? °Fruits °Aim to eat 2 cup-equivalents of fresh, canned (in natural juice), or frozen fruits each day. Examples of 1 cup-equivalent of fruit include 1 small apple, 8 large strawberries, 1 cup canned fruit, ½ cup   dried fruit, or 1 cup 100% juice. Vegetables Aim to eat 2-3 cup-equivalents of fresh and frozen vegetables each day, including different varieties and colors. Examples of 1 cup-equivalent of vegetables include 2 medium carrots, 2 cups raw, leafy greens, 1 cup chopped  vegetable (raw or cooked), or 1 medium baked potato. Grains Aim to eat 6 ounce-equivalents of whole grains each day. Examples of 1 ounce-equivalent of grains include 1 slice of bread, 1 cup ready-to-eat cereal, 3 cups popcorn, or  cup cooked rice, pasta, or cereal. Meats and other proteins Aim to eat 5-6 ounce-equivalents of protein each day. Examples of 1 ounce-equivalent of protein include 1 egg, 1/2 cup nuts or seeds, or 1 tablespoon (16 g) peanut butter. A cut of meat or fish that is the size of a deck of cards is about 3-4 ounce-equivalents.  Of the protein you eat each week, try to have at least 8 ounces come from seafood. This includes salmon, trout, herring, and anchovies. Dairy Aim to eat 3 cup-equivalents of fat-free or low-fat dairy each day. Examples of 1 cup-equivalent of dairy include 1 cup (240 mL) milk, 8 ounces (250 g) yogurt, 1 ounces (44 g) natural cheese, or 1 cup (240 mL) fortified soy milk. Fats and oils  Aim for about 5 teaspoons (21 g) per day. Choose monounsaturated fats, such as canola and olive oils, avocados, peanut butter, and most nuts, or polyunsaturated fats, such as sunflower, corn, and soybean oils, walnuts, pine nuts, sesame seeds, sunflower seeds, and flaxseed. Beverages  Aim for six 8-oz glasses of water per day. Limit coffee to three to five 8-oz cups per day.  Limit caffeinated beverages that have added calories, such as soda and energy drinks.  Limit alcohol intake to no more than 1 drink a day for nonpregnant women and 2 drinks a day for men. One drink equals 12 oz of beer (355 mL), 5 oz of wine (148 mL), or 1 oz of hard liquor (44 mL). Seasoning and other foods  Avoid adding excess amounts of salt to your foods. Try flavoring foods with herbs and spices instead of salt.  Avoid adding sugar to foods.  Try using oil-based dressings, sauces, and spreads instead of solid fats. This information is based on general [place] nutrition guidelines. For more  information, visit [employer].es. Exact amounts may vary based on your nutrition needs. Summary  A healthy eating plan may help you to maintain a healthy weight, reduce the risk of chronic diseases, and stay active throughout your life.  Plan your meals. Make sure you eat the right portions of a variety of nutrient-rich foods.  Try baking, boiling, grilling, or broiling instead of frying.  Choose healthy options in all settings, including home, work, school, restaurants, or stores. This information is not intended to replace advice given to you by your health care provider. Make sure you discuss any questions you have with your health care provider. Document Revised: 12/07/2017 Document Reviewed: 12/07/2017 Elsevier Patient Education  [employer].

## 2020-07-24 NOTE — Progress Notes (Signed)
Patient ID: Mia Mccoy, female    DOB: 1996/07/04  MRN: [id number]  CC: Annual Exam   Subjective: Mia Mccoy is a 24 y.o. female who presents for annual exam.  She has her toddler daughter with her. Her concerns today include:   Last seen in 2018.  She is not due for a Pap smear. Pt wanting to be checked for STI.  Having yellowish malodorous dischg for 2 mths Sexually active with one female partner.  Does not use condoms.  Had Chlamydia in 2017-2018 and was treated Has IUD but still gets menses every month.  Menses are heavy and last about 1 week.  She gives history of being anemic during her pregnancy and was on iron.  Complains of feeling cold and tired.  Denies any dizziness. Not breast feeding  Overweight:  She walks around her apartment complex QOD for 30 mins.  Feels she could do better with eating  Past medical, social, family history and surgical history reviewed.  Patient Active Problem List   Diagnosis Date Noted  . Influenza vaccine refused 07/24/2020  . IUD (intrauterine device) in place 07/11/2019  . Arrest of dilation, delivered, current hospitalization 02/07/2019  . GBS (group B Streptococcus carrier), +RV culture, currently pregnant 02/06/2019  . Indication for care in labor or delivery 02/06/2019  . Uterine size date discrepancy pregnancy, third trimester 01/13/2019  . Excessive weight gain during pregnancy in third trimester 01/13/2019  . Anemia affecting pregnancy 12/10/2018  . Supervision of normal first pregnancy, antepartum 07/20/2018  . Immunization due 07/07/2017  . Family planning 07/07/2017  . Marijuana use 07/07/2017     Current Outpatient Medications on File Prior to Visit  Medication Sig Dispense Refill  . ferrous sulfate 325 (65 FE) MG tablet Take 1 tablet (325 mg total) by mouth 2 (two) times daily with a meal. (Patient not taking: Reported on 07/24/2020) 60 tablet 3  . Prenatal Vit-Fe Phos-FA-Omega (VITAFOL GUMMIES) 3.33-0.333-34.8  MG CHEW Chew 3 each by mouth daily. (Patient not taking: Reported on 07/24/2020) 90 tablet 6   No current facility-administered medications on file prior to visit.    Allergies  Allergen Reactions  . Banana Itching    Social History   Socioeconomic History  . Marital status: Single    Spouse name: Not on file  . Number of children: 1  . Years of education: [employer]  . Highest education level: 12th grade  Occupational History  . Not on file  Tobacco Use  . Smoking status: Never Smoker  . Smokeless tobacco: Never Used  . Tobacco comment: Marijuana  Vaping Use  . Vaping Use: Never used  Substance and Sexual Activity  . Alcohol use: No  . Drug use: Not Currently    Types: Marijuana    Comment: Last use Sept 2019  . Sexual activity: Yes    Birth control/protection: I.U.D.  Other Topics Concern  . Not on file  Social History Narrative  . Not on file   Social Determinants of Health   Financial Resource Strain:   . Difficulty of Paying Living Expenses: Not on file  Food Insecurity:   . Worried About Programme researcher, broadcasting/film/video in the Last Year: Not on file  . Ran Out of Food in the Last Year: Not on file  Transportation Needs:   . Lack of Transportation (Medical): Not on file  . Lack of Transportation (Non-Medical): Not on file  Physical Activity:   . Days of Exercise per Week: Not  on file  . Minutes of Exercise per Session: Not on file  Stress:   . Feeling of Stress : Not on file  Social Connections:   . Frequency of Communication with Friends and Family: Not on file  . Frequency of Social Gatherings with Friends and Family: Not on file  . Attends Religious Services: Not on file  . Active Member of Clubs or Organizations: Not on file  . Attends Banker Meetings: Not on file  . Marital Status: Not on file  Intimate Partner Violence:   . Fear of Current or Ex-Partner: Not on file  . Emotionally Abused: Not on file  . Physically Abused: Not on file  .  Sexually Abused: Not on file    History reviewed. No pertinent family history.  Past Surgical History:  Procedure Laterality Date  . CESAREAN SECTION Bilateral 02/07/2019   Procedure: CESAREAN SECTION;  Surgeon: Reva Bores, MD;  Location: [facility] LD [facility]S;  Service: Obstetrics;  Laterality: Bilateral;  . NO PAST SURGERIES    . WISDOM TOOTH EXTRACTION      ROS: Review of Systems  Constitutional: Negative for appetite change.  HENT: Negative for congestion, hearing loss and sore throat.   Eyes: Negative for visual disturbance.  Respiratory: Negative for cough, chest tightness and shortness of breath.   Cardiovascular: Negative for chest pain.  Gastrointestinal: Negative for abdominal pain.  Genitourinary: Negative for difficulty urinating.  Musculoskeletal: Negative for arthralgias.  Psychiatric/Behavioral: Negative for dysphoric mood.     PHYSICAL EXAM: BP 118/73   Pulse 87   Resp 16   Ht 4\' 11"  (1.499 m)   Wt 146 lb 3.2 oz (66.3 kg)   SpO2 97%   BMI 29.53 kg/m   Physical Exam  General appearance - alert, well appearing, young African-American female and in no distress Mental status - normal mood, behavior, speech, dress, motor activity, and thought processes Eyes - pupils equal and reactive, extraocular eye movements intact.  Pale conjunctiva Ears - bilateral TM's and external ear canals normal Nose - normal and patent, no erythema, discharge or polyps Mouth - mucous membranes moist, pharynx normal without lesions Neck - supple, no significant adenopathy Lymphatics - no palpable lymphadenopathy, no hepatosplenomegaly Chest - clear to auscultation, no wheezes, rales or rhonchi, symmetric air entry Heart - normal rate, regular rhythm, normal S1, S2, no murmurs, rubs, clicks or gallops Abdomen - soft, nontender, nondistended, no masses or organomegaly Extremities - peripheral pulses normal, no pedal edema, no clubbing or cyanosis  Depression screen [facility] 2/9 07/24/2020  07/07/2017  Decreased Interest 0 0  Down, Depressed, Hopeless 0 0  PHQ - 2 Score 0 0     CMP Latest Ref Rng & Units 02/07/2019 02/07/2019 02/06/2019  Glucose 70 - 99 mg/dL 02/08/2019) - 446(K)  BUN 6 - 20 mg/dL 11 - 9  Creatinine 863(O - 1.00 mg/dL 1.77) 1.16(F) 7.90(X  Sodium 135 - 145 mmol/L 137 - 136  Potassium 3.5 - 5.1 mmol/L 4.3 - 3.8  Chloride 98 - 111 mmol/L 108 - 105  CO2 22 - 32 mmol/L 22 - 22  Calcium 8.9 - 10.3 mg/dL 8.2(L) - 9.2  Total Protein 6.5 - 8.1 g/dL 4.2(L) - 5.9(L)  Total Bilirubin 0.3 - 1.2 mg/dL 0.4 - 0.3  Alkaline Phos 38 - 126 U/L 123 - 204(H)  AST 15 - 41 U/L 25 - 18  ALT 0 - 44 U/L 12 - 15   Lipid Panel  No results found for: CHOL,  TRIG, HDL, CHOLHDL, VLDL, LDLCALC, LDLDIRECT  CBC    Component Value Date/Time   WBC 13.8 (H) 02/08/2019 0608   RBC 2.85 (L) 02/08/2019 0608   HGB 7.1 (L) 02/08/2019 0608   HGB 10.6 (L) 11/26/2018 0831   HCT 22.2 (L) 02/08/2019 0608   HCT 32.4 (L) 11/26/2018 0831   PLT 146 (L) 02/08/2019 0608   PLT 230 11/26/2018 0831   MCV 77.9 (L) 02/08/2019 0608   MCV 78 (L) 11/26/2018 0831   MCH 24.9 (L) 02/08/2019 0608   MCHC 32.0 02/08/2019 0608   RDW 15.8 (H) 02/08/2019 0608   RDW 12.7 11/26/2018 0831   LYMPHSABS 2.4 07/20/2018 0955   EOSABS 0.1 07/20/2018 0955   BASOSABS 0.0 07/20/2018 0955    ASSESSMENT AND PLAN: 1. Annual physical exam  2. Screen for STD (sexually transmitted disease) - HIV Antibody (routine testing w rflx) - Cervicovaginal ancillary only  3. Overweight (BMI 25.0-29.9) Discussed healthy eating habits.  Printed information also given. Encourage some form of moderate intensity exercise with goal of at least 150 minutes/week.  4. History of anemia - CBC - Iron, TIBC and Ferritin Panel  5. Influenza vaccine refused Recommended.  Patient declined.  6. COVID-19 vaccination declined Recommended.  Patient declined.  7. Need for hepatitis C screening test - Hepatitis C Antibody    Patient was given  the opportunity to ask questions.  Patient verbalized understanding of the plan and was able to repeat key elements of the plan.   Orders Placed This Encounter  Procedures  . CBC  . Iron, TIBC and Ferritin Panel  . HIV Antibody (routine testing w rflx)  . Hepatitis C Antibody     Requested Prescriptions    No prescriptions requested or ordered in this encounter    Return if symptoms worsen or fail to improve.  Jonah Blue, MD, FACP

## 2020-07-25 ENCOUNTER — Encounter: Payer: Self-pay | Admitting: Certified Nurse Midwife

## 2020-07-25 ENCOUNTER — Ambulatory Visit (INDEPENDENT_AMBULATORY_CARE_PROVIDER_SITE_OTHER): Payer: Medicaid Other | Admitting: Certified Nurse Midwife

## 2020-07-25 ENCOUNTER — Telehealth: Payer: Self-pay | Admitting: Internal Medicine

## 2020-07-25 ENCOUNTER — Encounter: Payer: Self-pay | Admitting: General Practice

## 2020-07-25 VITALS — BP 122/81 | HR 80 | Temp 98.8°F | Ht <= 58 in | Wt 145.8 lb

## 2020-07-25 DIAGNOSIS — Z30432 Encounter for removal of intrauterine contraceptive device: Secondary | ICD-10-CM | POA: Diagnosis not present

## 2020-07-25 LAB — CERVICOVAGINAL ANCILLARY ONLY
Bacterial Vaginitis (gardnerella): POSITIVE — AB
Candida Glabrata: NEGATIVE
Candida Vaginitis: POSITIVE — AB
Chlamydia: NEGATIVE
Comment: NEGATIVE
Comment: NEGATIVE
Comment: NEGATIVE
Comment: NEGATIVE
Comment: NEGATIVE
Comment: NORMAL
Neisseria Gonorrhea: NEGATIVE
Trichomonas: NEGATIVE

## 2020-07-25 LAB — CBC
Hematocrit: 41 % (ref 34.0–46.6)
Hemoglobin: 13.2 g/dL (ref 11.1–15.9)
MCH: 26.1 pg — ABNORMAL LOW (ref 26.6–33.0)
MCHC: 32.2 g/dL (ref 31.5–35.7)
MCV: 81 fL (ref 79–97)
Platelets: 282 10*3/uL (ref 150–450)
RBC: 5.06 x10E6/uL (ref 3.77–5.28)
RDW: 13.2 % (ref 11.7–15.4)
WBC: 5.3 10*3/uL (ref 3.4–10.8)

## 2020-07-25 LAB — HEPATITIS C ANTIBODY: Hep C Virus Ab: <0.1 s/co ratio (ref 0.0–0.9)

## 2020-07-25 LAB — HIV ANTIBODY (ROUTINE TESTING W REFLEX): HIV Screen 4th Generation wRfx: NONREACTIVE

## 2020-07-25 LAB — IRON,TIBC AND FERRITIN PANEL
Ferritin: 63 ng/mL (ref 15–150)
Iron Saturation: 15 % (ref 15–55)
Iron: 43 ug/dL (ref 27–159)
Total Iron Binding Capacity: 285 ug/dL (ref 250–450)
UIBC: 242 ug/dL (ref 131–425)

## 2020-07-25 MED ORDER — METRONIDAZOLE 500 MG PO TABS: 500.0000 mg | ORAL_TABLET | Freq: Two times a day (BID) | ORAL | 0 refills | Status: DC

## 2020-07-25 MED ORDER — FLUCONAZOLE 150 MG PO TABS: 150.0000 mg | ORAL_TABLET | Freq: Once | ORAL | 0 refills | Status: AC

## 2020-07-25 NOTE — Progress Notes (Signed)
    GYNECOLOGY CLINIC PROCEDURE NOTE  [name] is a 24 y.o. G2P1001 here for Liletta IUD removal. It was placed immediately postpartum after her Cesarean delivery on 02/07/2019. She has experienced intermittent cramping since the IUD placement, but is also ready for another child. No other GYN concerns.  Last pap smear was on 08/2018 and was normal.  IUD Removal  Patient was in the dorsal lithotomy position, normal external genitalia was noted.  A speculum was placed in the patient's vagina, normal discharge was noted, no lesions. The multiparous cervix was visualized, no lesions, no abnormal discharge.  The strings of the IUD were grasped and pulled using ring forceps. The IUD was removed in its entirety.  Patient tolerated the procedure well.    Patient plans for pregnancy soon and she was told to avoid teratogens, take PNV and folate. Routine preventative health maintenance measures emphasized. Follow up for annual gyn in one year or earlier if pregnant.  Bernerd Limbo, CNM 07/25/2020 1:01 PM

## 2020-07-25 NOTE — Telephone Encounter (Signed)
Phone call placed to patient today.  She is informed of results of vaginal secretion test.  She tested positive for bacterial vaginosis and yeast.  Test for chlamydia, gonorrhea and trichomonas were negative.  She is not breast-feeding.  Patient advised that I will send prescription to pharmacy for Diflucan and Flagyl.

## 2021-09-03 ENCOUNTER — Encounter: Payer: Medicaid Other | Admitting: Nurse Practitioner

## 2021-09-03 NOTE — Progress Notes (Signed)
Patient scheduled appointment under her name but meant to schedule for daughter that is 26 years old.  Advised she will need to schedule with pediatrician.

## 2021-11-16 DIAGNOSIS — N644 Mastodynia: Secondary | ICD-10-CM | POA: Diagnosis not present

## 2022-12-05 ENCOUNTER — Encounter (HOSPITAL_COMMUNITY): Payer: Self-pay

## 2022-12-05 ENCOUNTER — Ambulatory Visit (HOSPITAL_COMMUNITY)
Admission: EM | Admit: 2022-12-05 | Discharge: 2022-12-05 | Disposition: A | Discharge status: Home / Self Care | Payer: Self-pay | Attending: Nurse Practitioner | Admitting: Nurse Practitioner

## 2022-12-05 DIAGNOSIS — Z113 Encounter for screening for infections with a predominantly sexual mode of transmission: Secondary | ICD-10-CM | POA: Insufficient documentation

## 2022-12-05 DIAGNOSIS — N898 Other specified noninflammatory disorders of vagina: Secondary | ICD-10-CM | POA: Insufficient documentation

## 2022-12-05 DIAGNOSIS — N926 Irregular menstruation, unspecified: Secondary | ICD-10-CM | POA: Insufficient documentation

## 2022-12-05 LAB — HIV ANTIBODY (ROUTINE TESTING W REFLEX): HIV Screen 4th Generation wRfx: NONREACTIVE

## 2022-12-05 LAB — POC URINE PREG, ED: Preg Test, Ur: NEGATIVE

## 2022-12-05 MED ORDER — VALACYCLOVIR HCL 1 G PO TABS: 1000.0000 mg | ORAL_TABLET | Freq: Two times a day (BID) | ORAL | 0 refills | Status: AC

## 2022-12-05 NOTE — Discharge Instructions (Signed)
We have tested you today for herpes simplex virus, trichomonas, gonorrhea, chlamydia, HIV and syphilis.  We will call or notify you with any positive results and prescribe treatment at that time. In the meantime, start the Valtrex to help treat the vaginal lesions in case it is herpes.

## 2022-12-05 NOTE — ED Provider Notes (Signed)
[facility]    CSN: [id number] Arrival date & time: 12/05/22  [id number]      History   Chief Complaint Chief Complaint  Patient presents with   STD check    HPI Mia Mccoy is a 27 y.o. female.   Patient presents today for STD testing.  Reports she is having white vaginal discharge and intermittent vaginal itching.  Reports it is not different than her normal discharge.  Also reports vaginal sores on the outside of her vagina that are tender to touch for the past 3 days.  Patient denies dysuria, burning with urination, increased urinary frequency or urgency, hematuria.  No abdominal pain, nausea/vomiting, fever, bodyaches, chills.  Has been having some pain and swollen lumps in the groin.  Patient is tearful and reports she is concerned that she may have herpes.    Past Medical History:  Diagnosis Date   Anemia    Medical history non-contributory    Thyroid disorder     Patient Active Problem List   Diagnosis Date Noted   Influenza vaccine refused 07/24/2020   IUD (intrauterine device) in place 07/11/2019   Arrest of dilation, delivered, current hospitalization 02/07/2019   GBS (group B Streptococcus carrier), +RV culture, currently pregnant 02/06/2019   Indication for care in labor or delivery 02/06/2019   Uterine size date discrepancy pregnancy, third trimester 01/13/2019   Excessive weight gain during pregnancy in third trimester 01/13/2019   Anemia affecting pregnancy 12/10/2018   Supervision of normal first pregnancy, antepartum 07/20/2018   Immunization due 07/07/2017   Family planning 07/07/2017   Marijuana use 07/07/2017    Past Surgical History:  Procedure Laterality Date   CESAREAN SECTION Bilateral 02/07/2019   Procedure: CESAREAN SECTION;  Surgeon: Donnamae Jude, MD;  Location: [facility] LD [facility]S;  Service: Obstetrics;  Laterality: Bilateral;   NO PAST SURGERIES     WISDOM TOOTH EXTRACTION      OB History     Gravida  2   Para  1   Term  1    Preterm      AB      Living  1      SAB      IAB      Ectopic      Multiple  0   Live Births  1            Home Medications    Prior to Admission medications   Medication Sig Start Date End Date Taking? Authorizing Provider  valACYclovir (VALTREX) 1000 MG tablet Take 1 tablet (1,000 mg total) by mouth 2 (two) times daily for 10 days. 12/05/22 12/15/22 Yes Eulogio Bear, NP  ferrous sulfate 325 (65 FE) MG tablet Take 1 tablet (325 mg total) by mouth 2 (two) times daily with a meal. Patient not taking: Reported on 07/24/2020 02/09/19   Rasch, Artist Pais, NP  Prenatal Vit-Fe Phos-FA-Omega (VITAFOL GUMMIES) 3.33-0.333-34.8 MG CHEW Chew 3 each by mouth daily. Patient not taking: Reported on 07/24/2020 02/23/19   Laury Deep, CNM    Family History Family History  Problem Relation Age of Onset   Cancer Father     Social History Social History   Tobacco Use   Smoking status: Never   Smokeless tobacco: Never   Tobacco comments:    Marijuana  Vaping Use   Vaping Use: Never used  Substance Use Topics   Alcohol use: No   Drug use: Not Currently    Types: Marijuana  Comment: Last use Sept 2019     Allergies   Banana   Review of Systems Review of Systems Per HPI  Physical Exam Triage Vital Signs ED Triage Vitals  Enc Vitals Group     BP 12/05/22 0923 120/80     Pulse Rate 12/05/22 0923 (!) 102     Resp 12/05/22 0923 14     Temp 12/05/22 0923 98.7 F (37.1 C)     Temp Source 12/05/22 0923 Oral     SpO2 12/05/22 0923 99 %     Weight --      Height --      Head Circumference --      Peak Flow --      Pain Score 12/05/22 0925 0     Pain Loc --      Pain Edu? --      Excl. in [place]? --    No data found.  Updated Vital Signs BP 120/80 (BP Location: Left Arm)   Pulse (!) 102   Temp 98.7 F (37.1 C) (Oral)   Resp 14   LMP 10/10/2022 Comment: Irregular periods  SpO2 99%   Visual Acuity Right Eye Distance:   Left Eye Distance:    Bilateral Distance:    Right Eye Near:   Left Eye Near:    Bilateral Near:     Physical Exam Vitals and nursing note reviewed. Exam conducted with a chaperone present (Deametrice [place], CMA).  Constitutional:      General: She is not in acute distress.    Appearance: Normal appearance. She is not toxic-appearing.  Pulmonary:     Effort: Pulmonary effort is normal. No respiratory distress.  Genitourinary:    Pubic Area: No rash.      Labia:        Left: Lesion present.      Vagina: Vaginal discharge present.       Comments: Circular, ulcerated lesions to external genitalia and approximately areas marked; no surrounding erythema or fluctuance; the lesions are tender to touch  Yellow/green discharge noted in the vaginal vault Lymphadenopathy:     Lower Body: Right inguinal adenopathy present. Left inguinal adenopathy present.  Skin:    General: Skin is warm and dry.     Coloration: Skin is not jaundiced or pale.     Findings: No erythema.  Neurological:     Mental Status: She is alert and oriented to person, place, and time.     Motor: No weakness.     Gait: Gait normal.  Psychiatric:        Mood and Affect: Mood normal.        Behavior: Behavior is cooperative.      UC Treatments / Results  Labs (all labs ordered are listed, but only abnormal results are displayed) Labs Reviewed  HSV CULTURE AND TYPING  HIV ANTIBODY (ROUTINE TESTING W REFLEX)  RPR  POC URINE PREG, ED  CERVICOVAGINAL ANCILLARY ONLY    EKG   Radiology No results found.  Procedures Procedures (including critical care time)  Medications Ordered in UC Medications - No data to display  Initial Impression / Assessment and Plan / UC Course  I have reviewed the triage vital signs and the nursing notes.  Pertinent labs & imaging results that were available during my care of the patient were reviewed by me and considered in my medical decision making (see chart for details).   Patient is  well-appearing, normotensive, afebrile, not tachycardic, not tachypneic, oxygenating well  on room air.    1. Vaginal lesion 2. Screening for STD (sexually transmitted disease) I am suspicious for HSV HSV culture obtained Will treat empirically with Valtrex twice daily for 10 days while culture is pending Cytology is pending to check for yeast infection, BV, trichomonas, gonorrhea, and chlamydia HIV and syphilis testing also obtained Recommended condom use with every sexual encounter   3. Irregular menses UPT today is negative  The patient was given the opportunity to ask questions.  All questions answered to their satisfaction.  The patient is in agreement to this plan.    Final Clinical Impressions(s) / UC Diagnoses   Final diagnoses:  Vaginal lesion  Screening for STD (sexually transmitted disease)  Irregular menses     Discharge Instructions      We have tested you today for herpes simplex virus, trichomonas, gonorrhea, chlamydia, HIV and syphilis.  We will call or notify you with any positive results and prescribe treatment at that time. In the meantime, start the Valtrex to help treat the vaginal lesions in case it is herpes.      ED Prescriptions     Medication Sig Dispense Auth. Provider   valACYclovir (VALTREX) 1000 MG tablet Take 1 tablet (1,000 mg total) by mouth 2 (two) times daily for 10 days. 20 tablet Eulogio Bear, NP      PDMP not reviewed this encounter.   Eulogio Bear, NP 12/05/22 [phone]

## 2022-12-05 NOTE — ED Triage Notes (Signed)
Patient is requesting an STD check. Patient states she is having a white vaginal discharge, intermittent itching. Patient denies any urinary symptoms or lower abdominal pain.

## 2022-12-06 LAB — RPR: RPR Ser Ql: NONREACTIVE

## 2022-12-07 LAB — HSV CULTURE AND TYPING

## 2022-12-08 ENCOUNTER — Telehealth (HOSPITAL_COMMUNITY): Payer: Self-pay | Admitting: Emergency Medicine

## 2022-12-08 LAB — CERVICOVAGINAL ANCILLARY ONLY
Bacterial Vaginitis (gardnerella): POSITIVE — AB
Chlamydia: NEGATIVE
Comment: NEGATIVE
Comment: NEGATIVE
Comment: NEGATIVE
Comment: NORMAL
Neisseria Gonorrhea: NEGATIVE
Trichomonas: NEGATIVE

## 2022-12-08 MED ORDER — METRONIDAZOLE 500 MG PO TABS: 500.0000 mg | ORAL_TABLET | Freq: Two times a day (BID) | ORAL | 0 refills | Status: DC

## 2023-01-26 ENCOUNTER — Encounter: Payer: Medicaid Other | Admitting: Obstetrics & Gynecology

## 2023-05-18 ENCOUNTER — Inpatient Hospital Stay (HOSPITAL_COMMUNITY): Payer: 59

## 2023-05-18 ENCOUNTER — Encounter (HOSPITAL_COMMUNITY): Payer: Self-pay | Admitting: *Deleted

## 2023-05-18 ENCOUNTER — Inpatient Hospital Stay (HOSPITAL_COMMUNITY)
Admission: AD | Admit: 2023-05-18 | Discharge: 2023-05-18 | Disposition: A | Discharge status: Home / Self Care | Payer: 59 | Attending: Obstetrics and Gynecology | Admitting: Obstetrics and Gynecology

## 2023-05-18 DIAGNOSIS — O200 Threatened abortion: Secondary | ICD-10-CM | POA: Insufficient documentation

## 2023-05-18 DIAGNOSIS — Z3A01 Less than 8 weeks gestation of pregnancy: Secondary | ICD-10-CM | POA: Diagnosis not present

## 2023-05-18 DIAGNOSIS — O3680X0 Pregnancy with inconclusive fetal viability, not applicable or unspecified: Secondary | ICD-10-CM | POA: Insufficient documentation

## 2023-05-18 DIAGNOSIS — Z3A08 8 weeks gestation of pregnancy: Secondary | ICD-10-CM | POA: Diagnosis not present

## 2023-05-18 LAB — CBC
HCT: 38.7 % (ref 36.0–46.0)
Hemoglobin: 12.6 g/dL (ref 12.0–15.0)
MCH: 26.3 pg (ref 26.0–34.0)
MCHC: 32.6 g/dL (ref 30.0–36.0)
MCV: 80.6 fL (ref 80.0–100.0)
Platelets: 311 10*3/uL (ref 150–400)
RBC: 4.8 MIL/uL (ref 3.87–5.11)
RDW: 13.9 % (ref 11.5–15.5)
WBC: 8.5 10*3/uL (ref 4.0–10.5)
nRBC: 0 % (ref 0.0–0.2)

## 2023-05-18 LAB — WET PREP, GENITAL
Clue Cells Wet Prep HPF POC: NONE SEEN
Sperm: NONE SEEN
Trich, Wet Prep: NONE SEEN
WBC, Wet Prep HPF POC: <10 (ref <10)
Yeast Wet Prep HPF POC: NONE SEEN

## 2023-05-18 LAB — URINALYSIS, ROUTINE W REFLEX MICROSCOPIC
Bilirubin Urine: NEGATIVE
Glucose, UA: NEGATIVE mg/dL
Hgb urine dipstick: NEGATIVE
Ketones, ur: NEGATIVE mg/dL
Nitrite: NEGATIVE
Protein, ur: NEGATIVE mg/dL
Specific Gravity, Urine: <1.005 — ABNORMAL LOW (ref 1.005–1.030)
pH: 5.5 (ref 5.0–8.0)

## 2023-05-18 LAB — URINALYSIS, MICROSCOPIC (REFLEX): RBC / HPF: NONE SEEN RBC/hpf (ref 0–5)

## 2023-05-18 LAB — HIV ANTIBODY (ROUTINE TESTING W REFLEX): HIV Screen 4th Generation wRfx: NONREACTIVE

## 2023-05-18 LAB — POCT PREGNANCY, URINE: Preg Test, Ur: POSITIVE — AB

## 2023-05-18 LAB — HCG, QUANTITATIVE, PREGNANCY: hCG, Beta Chain, Quant, S: 95370 m[IU]/mL — ABNORMAL HIGH (ref <5)

## 2023-05-18 NOTE — Discharge Instructions (Signed)
Return to MAU: If you have heavy bleeding that soaks through more that 2 pads per hour for an hour or more If you bleed so much that you feel like you might pass out or you do pass out If you have significant abdominal pain that is not improved with Tylenol 1000 mg every 8 hours as needed for pain If you develop a fever > 100.5

## 2023-05-18 NOTE — MAU Note (Signed)
.  Mia Mccoy is a 28 y.o. at Unknown here in MAU reporting: was at a mobile [place] clinic today, they were able to see a baby with a heartbeat last week, but today she states they were unable to find it. Denies VB or pain. States she still has morning sickness, but has not thrown up today.  LMP: 03/23/23  Pain score: 0 Vitals:   05/18/23 1052  BP: 119/63  Pulse: 86  Resp: 14  SpO2: 100%     FHT:n/a Lab orders placed from triage:  UPT

## 2023-05-18 NOTE — MAU Provider Note (Signed)
History     CSN: [id number]  Arrival date and time: 05/18/23 1033   Event Date/Time   First Provider Initiated Contact with Patient 05/18/23 1100      Chief Complaint  Patient presents with   No Fetal Heartbeat   HPI [name] is a 27 y.o. year old G70P1011 female at [redacted]w[redacted]d weeks gestation who presents to MAU reporting she was seen last week at a mobile U/S clinic where she was told the baby measured 6 weeks and the FHR was 105 bpm. She states, "the lady said the heartbeat was really low and she wanted to check it again this week. Today the lady said she could see the baby and the baby measured the same as last week, but she could not detect a heartbeat this week. The lady told me to come here to get an official U/S." She reports she still has had morning sickness, but no vomiting today. She denies VB or pain. She has a h/o SAB in March of this year, but unsure of how many weeks she was. She did not have have any complications.  OB History     Gravida  3   Para  1   Term  1   Preterm      AB  1   Living  1      SAB  1   IAB      Ectopic      Multiple  0   Live Births  1           Past Medical History:  Diagnosis Date   Anemia    Medical history non-contributory    Thyroid disorder     Past Surgical History:  Procedure Laterality Date   CESAREAN SECTION Bilateral 02/07/2019   Procedure: CESAREAN SECTION;  Surgeon: Reva Bores, MD;  Location: [facility];  Service: Obstetrics;  Laterality: Bilateral;   NO PAST SURGERIES     WISDOM TOOTH EXTRACTION      Family History  Problem Relation Age of Onset   Cancer Father     Social History   Tobacco Use   Smoking status: Never   Smokeless tobacco: Never   Tobacco comments:    Marijuana  Vaping Use   Vaping status: Never Used  Substance Use Topics   Alcohol use: No   Drug use: Not Currently    Types: Marijuana    Comment: Last use Sept 2019    Allergies:  Allergies  Allergen Reactions    Banana Itching    No medications prior to admission.    Review of Systems  Constitutional: Negative.   HENT: Negative.    Eyes: Negative.   Respiratory: Negative.    Cardiovascular: Negative.   Gastrointestinal: Negative.   Endocrine: Negative.   Genitourinary: Negative.   Musculoskeletal: Negative.   Skin: Negative.   Allergic/Immunologic: Negative.   Neurological: Negative.   Hematological: Negative.   Psychiatric/Behavioral:  The patient is nervous/anxious ("very scared").    Physical Exam   Blood pressure 119/63, pulse 86, temperature 98 F (36.7 C), temperature source Oral, resp. rate 14, height 4\' 11"  (1.499 m), weight 67.5 kg, last menstrual period 03/23/2023, SpO2 100%.  Physical Exam Vitals and nursing note reviewed.  Constitutional:      Appearance: Normal appearance. She is obese.  Cardiovascular:     Rate and Rhythm: Normal rate.  Pulmonary:     Effort: Pulmonary effort is normal.  Abdominal:     Palpations:  Abdomen is soft.  Musculoskeletal:        General: Normal range of motion.  Skin:    General: Skin is warm and dry.  Neurological:     Mental Status: She is alert and oriented to person, place, and time.  Psychiatric:        Mood and Affect: Mood is anxious.        Behavior: Behavior normal.        Thought Content: Thought content normal.        Judgment: Judgment normal.    MAU Course  Procedures  MDM CCUA UPT,  CBC,  ABO/Rh -- not drawn; known O POS HCG,  OB U/S < 14 wks,  Wet Prep GC/CT -- Results pending   Results for orders placed or performed during the hospital encounter of 05/18/23 (from the past 24 hour(s))  Pregnancy, urine POC     Status: Abnormal   Collection Time: 05/18/23 10:58 AM  Result Value Ref Range   Preg Test, Ur POSITIVE (A) NEGATIVE  Urinalysis, Routine w reflex microscopic -Urine, Clean Catch     Status: Abnormal   Collection Time: 05/18/23 11:02 AM  Result Value Ref Range   Color, Urine YELLOW YELLOW    APPearance CLOUDY (A) CLEAR   Specific Gravity, Urine <1.005 (L) 1.005 - 1.030   pH 5.5 5.0 - 8.0   Glucose, UA NEGATIVE NEGATIVE mg/dL   Hgb urine dipstick NEGATIVE NEGATIVE   Bilirubin Urine NEGATIVE NEGATIVE   Ketones, ur NEGATIVE NEGATIVE mg/dL   Protein, ur NEGATIVE NEGATIVE mg/dL   Nitrite NEGATIVE NEGATIVE   Leukocytes,Ua LARGE (A) NEGATIVE  Urinalysis, Microscopic (reflex)     Status: Abnormal   Collection Time: 05/18/23 11:02 AM  Result Value Ref Range   RBC / HPF NONE SEEN 0 - 5 RBC/hpf   WBC, UA 11-20 0 - 5 WBC/hpf   Bacteria, UA MANY (A) NONE SEEN   Squamous Epithelial / HPF 11-20 0 - 5 /HPF   Mucus PRESENT   hCG, quantitative, pregnancy     Status: Abnormal   Collection Time: 05/18/23 11:18 AM  Result Value Ref Range   hCG, Beta Chain, Quant, S 95,370 (H) <5 mIU/mL  CBC     Status: None   Collection Time: 05/18/23 11:18 AM  Result Value Ref Range   WBC 8.5 4.0 - 10.5 K/uL   RBC 4.80 3.87 - 5.11 MIL/uL   Hemoglobin 12.6 12.0 - 15.0 g/dL   HCT 16.1 09.6 - 04.5 %   MCV 80.6 80.0 - 100.0 fL   MCH 26.3 26.0 - 34.0 pg   MCHC 32.6 30.0 - 36.0 g/dL   RDW 40.9 81.1 - 91.4 %   Platelets 311 150 - 400 K/uL   nRBC 0.0 0.0 - 0.2 %  HIV Antibody (routine testing w rflx)     Status: None   Collection Time: 05/18/23 11:18 AM  Result Value Ref Range   HIV Screen 4th Generation wRfx Non Reactive Non Reactive  Wet prep, genital     Status: None   Collection Time: 05/18/23 11:31 AM  Result Value Ref Range   Yeast Wet Prep HPF POC NONE SEEN NONE SEEN   Trich, Wet Prep NONE SEEN NONE SEEN   Clue Cells Wet Prep HPF POC NONE SEEN NONE SEEN   WBC, Wet Prep HPF POC <10 <10   Sperm NONE SEEN    US OB LESS THAN 14 WEEKS WITH OB TRANSVAGINAL  Result Date: 05/18/2023 CLINICAL DATA:  History of spontaneous abortions for confirmation of viability EXAM: OBSTETRIC <14 WK [place] AND TRANSVAGINAL OB US TECHNIQUE: Both transabdominal and transvaginal ultrasound examinations were performed for  complete evaluation of the gestation as well as the maternal uterus, adnexal regions, and pelvic cul-de-sac. Transvaginal technique was performed to assess early pregnancy. COMPARISON:  None Available. FINDINGS: Intrauterine gestational sac: Single Yolk sac:  Visualized. Embryo:  Visualized. Cardiac Activity: Not definitely visualized. Heart Rate: NA MSD: N/A CRL:  4.1 mm   6 w   1 d                  [place] EDC: 01/10/2024 Subchorionic hemorrhage:  None visualized. Maternal uterus/adnexae: Normal ovaries. IMPRESSION: Intrauterine pregnancy at 6 weeks 1 day gestation by crown-rump length of unknown viability. Fetal cardiac activity is not definitely visualized. Electronically Signed   By: Agustin Cree M.D.   On: 05/18/2023 12:35     *Consult with Dr. Donavan Foil @ 1450 - notified of patient's complaints, assessments, lab & U/S results, recommended tx plan give threatened miscarriage precautions - ok to d/c home  Assessment and Plan  1. Threatened miscarriage in early pregnancy - Information provided on threatened miscarriage   2. [redacted] weeks gestation of pregnancy  3. Pregnancy with uncertain fetal viability, single or unspecified fetus - US OB LESS THAN 14 WEEKS WITH OB TRANSVAGINAL; in 2 wks  - Discharge patient - Keep scheduled appt with [facility] on 05/25/2023 @ 0915 - Message sent to [facility] to watch for results of U/S - possibly may need to cancel/reschedule upcoming appts - Patient verbalized an understanding of the plan of care and agrees.  Raelyn Mora, CNM 05/18/2023, 11:16 AM

## 2023-05-19 LAB — GC/CHLAMYDIA PROBE AMP (~~LOC~~) NOT AT ARMC
Chlamydia: NEGATIVE
Comment: NEGATIVE
Comment: NORMAL
Neisseria Gonorrhea: NEGATIVE

## 2023-05-19 LAB — CULTURE, OB URINE: Culture: >=100000 — AB

## 2023-05-25 ENCOUNTER — Ambulatory Visit (INDEPENDENT_AMBULATORY_CARE_PROVIDER_SITE_OTHER): Payer: 59

## 2023-05-25 ENCOUNTER — Other Ambulatory Visit: Payer: Self-pay

## 2023-05-25 ENCOUNTER — Ambulatory Visit (INDEPENDENT_AMBULATORY_CARE_PROVIDER_SITE_OTHER): Payer: 59 | Admitting: Obstetrics & Gynecology

## 2023-05-25 ENCOUNTER — Encounter: Payer: Self-pay | Admitting: Obstetrics & Gynecology

## 2023-05-25 DIAGNOSIS — Z3A01 Less than 8 weeks gestation of pregnancy: Secondary | ICD-10-CM | POA: Diagnosis not present

## 2023-05-25 DIAGNOSIS — O021 Missed abortion: Secondary | ICD-10-CM | POA: Diagnosis not present

## 2023-05-25 DIAGNOSIS — O200 Threatened abortion: Secondary | ICD-10-CM | POA: Diagnosis not present

## 2023-05-25 MED ORDER — HYDROCODONE-ACETAMINOPHEN 5-325 MG PO TABS: 1.0000 | ORAL_TABLET | Freq: Four times a day (QID) | ORAL | 0 refills | Status: DC | PRN

## 2023-05-25 MED ORDER — PROMETHAZINE HCL 25 MG PO TABS: 12.5000 mg | ORAL_TABLET | Freq: Four times a day (QID) | ORAL | 2 refills | Status: DC | PRN

## 2023-05-25 MED ORDER — MISOPROSTOL 200 MCG PO TABS: ORAL_TABLET | ORAL | 1 refills | Status: DC

## 2023-05-25 MED ORDER — IBUPROFEN 600 MG PO TABS: 600.0000 mg | ORAL_TABLET | Freq: Four times a day (QID) | ORAL | 2 refills | Status: DC | PRN

## 2023-05-25 NOTE — Progress Notes (Signed)
Ultrasounds Results Note  SUBJECTIVE HPI:  Mia Mccoy is a 27 y.o. G3P1011 at [redacted]w[redacted]d by LMP who presents to the office for followup ultrasound results. The patient denies abdominal pain or vaginal bleeding.  Upon review of the patient's records, patient was first seen in MAU on 05/18/23 for having outside ultrasound with no FHR.   Ultrasound showed [redacted]w[redacted]d fetus, no FHR.  Repeat ultrasound was performed earlier today.   Past Medical History:  Diagnosis Date   Anemia    Marijuana use 07/07/2017   Medical history non-contributory    Thyroid disorder    Past Surgical History:  Procedure Laterality Date   CESAREAN SECTION Bilateral 02/07/2019   Procedure: CESAREAN SECTION;  Surgeon: Reva Bores, MD;  Location: [facility];  Service: Obstetrics;  Laterality: Bilateral;   NO PAST SURGERIES     WISDOM TOOTH EXTRACTION     Social History   Socioeconomic History   Marital status: Single    Spouse name: Not on file   Number of children: 1   Years of education: High School   Highest education level: 12th grade  Occupational History   Not on file  Tobacco Use   Smoking status: Never   Smokeless tobacco: Never   Tobacco comments:    Marijuana  Vaping Use   Vaping status: Never Used  Substance and Sexual Activity   Alcohol use: No   Drug use: Not Currently    Types: Marijuana    Comment: Last use Sept 2019   Sexual activity: Yes    Birth control/protection: I.U.D.  Other Topics Concern   Not on file  Social History Narrative   Not on file   Social Determinants of Health   Financial Resource Strain: Low Risk  (07/20/2018)   Overall Financial Resource Strain (CARDIA)    Difficulty of Paying Living Expenses: Not hard at all  Food Insecurity: No Food Insecurity (07/20/2018)   Hunger Vital Sign    Worried About Running Out of Food in the Last Year: Never true    Ran Out of Food in the Last Year: Never true  Transportation Needs: No Transportation Needs (07/20/2018)    PRAPARE - Administrator, Civil Service (Medical): No    Lack of Transportation (Non-Medical): No  Physical Activity: Inactive (07/20/2018)   Exercise Vital Sign    Days of Exercise per Week: 0 days    Minutes of Exercise per Session: 0 min  Stress: Stress Concern Present (07/20/2018)   [employer] of Occupational Health - Occupational Stress Questionnaire    Feeling of Stress : To some extent  Social Connections: Somewhat Isolated (07/20/2018)   Social Connection and Isolation Panel [NHANES]    Frequency of Communication with Friends and Family: More than three times a week    Frequency of Social Gatherings with Friends and Family: More than three times a week    Attends Religious Services: 1 to 4 times per year    Active Member of [employer] or Organizations: No    Attends Banker Meetings: Never    Marital Status: Never married  Intimate Partner Violence: Not At Risk (07/20/2018)   Humiliation, Afraid, Rape, and Kick questionnaire    Fear of Current or Ex-Partner: No    Emotionally Abused: No    Physically Abused: No    Sexually Abused: No   Current Outpatient Medications on File Prior to Visit  Medication Sig Dispense Refill   ferrous sulfate 325 (65 FE) MG  tablet Take 1 tablet (325 mg total) by mouth 2 (two) times daily with a meal. (Patient not taking: Reported on 07/24/2020) 60 tablet 3   Prenatal Vit-Fe Phos-FA-Omega (VITAFOL GUMMIES) 3.33-0.333-34.8 MG CHEW Chew 3 each by mouth daily. (Patient not taking: Reported on 07/24/2020) 90 tablet 6   No current facility-administered medications on file prior to visit.   Allergies  Allergen Reactions   Banana Itching    I have reviewed patient's Past Medical Hx, Surgical Hx, Family Hx, Social Hx, medications and allergies.   Review of Systems Review of Systems  Constitutional: Negative for fever and chills.  Gastrointestinal: Negative for nausea, vomiting, abdominal pain, diarrhea and constipation.   Genitourinary: Negative for dysuria.  Musculoskeletal: Negative for back pain.  Neurological: Negative for dizziness and weakness.    Physical Exam  LMP 03/23/2023 Comment: Irregular periods  GENERAL: Well-developed, well-nourished female in no acute distress.  HEENT: Normocephalic, atraumatic.   LUNGS: Effort normal ABDOMEN: soft, non-tender HEART: Regular rate  SKIN: Warm, dry and without erythema PSYCH: Normal mood and affect NEURO: Alert and oriented x 4   IMAGING US OB LESS THAN 14 WEEKS WITH OB TRANSVAGINAL  Result Date: 05/18/2023 CLINICAL DATA:  History of spontaneous abortions for confirmation of viability EXAM: OBSTETRIC <14 WK [place] AND TRANSVAGINAL OB US TECHNIQUE: Both transabdominal and transvaginal ultrasound examinations were performed for complete evaluation of the gestation as well as the maternal uterus, adnexal regions, and pelvic cul-de-sac. Transvaginal technique was performed to assess early pregnancy. COMPARISON:  None Available. FINDINGS: Intrauterine gestational sac: Single Yolk sac:  Visualized. Embryo:  Visualized. Cardiac Activity: Not definitely visualized. Heart Rate: NA MSD: N/A CRL:  4.1 mm   6 w   1 d                  [place] EDC: 01/10/2024 Subchorionic hemorrhage:  None visualized. Maternal uterus/adnexae: Normal ovaries. IMPRESSION: Intrauterine pregnancy at 6 weeks 1 day gestation by crown-rump length of unknown viability. Fetal cardiac activity is not definitely visualized. Electronically Signed   By: Agustin Cree M.D.   On: 05/18/2023 12:35    Preliminary result from ultrasound today:  No change from 05/18/23 ultrasound, no growth progression, no FHR, consistent with early pregnancy demise.   ASSESSMENT 1. Early intrauterine fetal demise     PLAN Discussed diagnosis with patient, support given to her.  Discussed management of missed abortion: expectant management vs misoprostol vs D&E.  Risks and benefits of all modalities discussed; all questions answered.   Patient opted for misoprostol administration.  The protocol/checklist is below:  Early Intrauterine Pregnancy Failure Treatment with Misoprostol  _X__  Documented intrauterine pregnancy failure less than or equal to [redacted] weeks gestation  _X__  No serious current illness  _X__  Baseline Hgb greater than or equal to 10g/dl  _X__  Patient has easily accessible transportation to the hospital  _X__  Clear preference  _X__  Practitioner/physician deems patient reliable  _X__  Counseling by practitioner or physician  _X__  Patient education by MD  _X__ Medications dispensed  _X__   Cytotec 800 mcg intravaginally by patient at home       _X__  Ibuprofen 600 mg 1 tablet by mouth every 6 hours as needed #30  _X__  Hydrocodone/acetaminophen 5/325 mg by mouth every 4 to 6 hours as needed #15  _X__  Phenergan 12.5 -25 mg by mouth every 4 hours as needed for nausea #30  Risks and benefits of medical management were carefully explained,  including a success rate of 80-90%, the need for another person to be at home with her, and to call/come in if she had heavy bleeding, dizziness, or severe pain not relieved by medication.  If there has been no passage of pregnancy, will consider repeat misoprostol vs D&E.  Patient advised to call or come in for evaluation for any concerning symptoms; bleeding precautions strictly emphasized.   Follow up appointment scheduled for 2 weeks.   Jaynie Collins, MD, FACOG Obstetrician & Gynecologist, [facility] [employer], [facility]

## 2023-06-02 ENCOUNTER — Other Ambulatory Visit: Payer: Self-pay

## 2023-06-02 DIAGNOSIS — O200 Threatened abortion: Secondary | ICD-10-CM

## 2023-06-03 ENCOUNTER — Other Ambulatory Visit: Payer: 59

## 2023-06-09 ENCOUNTER — Ambulatory Visit: Payer: 59 | Admitting: Obstetrics and Gynecology

## 2023-06-18 ENCOUNTER — Encounter: Payer: 59 | Admitting: Obstetrics and Gynecology

## 2023-10-01 ENCOUNTER — Telehealth: Payer: Self-pay | Admitting: Family Medicine

## 2023-10-01 NOTE — Telephone Encounter (Signed)
Patient called in regarding a sooner appointment. I informed the patient that she is scheduled in [facility] for March. Also let her know that she can not go between [place] and [facility] an which off she will like to be seen at. As per patient the one who will see her sooner. She decided to stay with [facility].

## 2023-11-12 NOTE — Progress Notes (Signed)
 ANNUAL EXAM Patient name: Mia Mccoy MRN [id number]  Date of birth: May 24, 1996 Chief Complaint:   No chief complaint on file.  History of Present Illness:   Mia Mccoy is a 28 y.o. G26P1011 female being seen today for a routine annual exam.   Current complaints: no concerns.   Patient's last menstrual period was 03/23/2023.  The pregnancy intention screening data noted above was reviewed. Potential methods of contraception were discussed. The patient elected to proceed with No data recorded.   Last pap 08/13/18 NILM, 07/07/2017 NILM. H/O abnormal pap: unsure Last mammogram: Not yet indicated due to age. Family h/o breast cancer: no Last colonoscopy: Not yet indicated due to age.  Family h/o colorectal cancer: no STI screening: accepts Contraception: None     07/24/2020   10:26 AM 07/07/2017    2:41 PM  Depression screen PHQ 2/9  Decreased Interest 0 0  Down, Depressed, Hopeless 0 0  PHQ - 2 Score 0 0        07/24/2020   10:26 AM 07/07/2017    2:41 PM  GAD 7 : Generalized Anxiety Score  Nervous, Anxious, on Edge 0 0  Control/stop worrying 0 0  Worry too much - different things 0 1  Trouble relaxing 0 0  Restless 0 0  Easily annoyed or irritable 0 1  Afraid - awful might happen 0 0  Total GAD 7 Score 0 2     Review of Systems:   Pertinent items are noted in HPI Denies any headaches, blurred vision, fatigue, shortness of breath, chest pain, abdominal pain, abnormal vaginal discharge/itching/odor/irritation, problems with periods, bowel movements, urination, or intercourse unless otherwise stated above. Pertinent History Reviewed:  Reviewed past medical,surgical, social and family history.  Reviewed problem list, medications and allergies. Physical Assessment:  There were no vitals filed for this visit.There is no height or weight on file to calculate BMI.        Physical Examination:   General appearance - well appearing, and in no distress  Mental  status - alert, oriented to person, place, and time  Psych:  She has a normal mood and affect  Skin - warm and dry, normal color, no suspicious lesions noted  Chest - effort normal, all lung fields clear to auscultation bilaterally  Heart - normal rate and regular rhythm  Neck:  midline trachea, no thyromegaly or nodules  Breasts - Deferred  Abdomen - soft, nontender, nondistended, no masses or organomegaly  Pelvic - VULVA: normal appearing vulva with no masses, tenderness or lesions  VAGINA: normal appearing vagina with normal color and discharge, no lesions  CERVIX: normal appearing cervix without discharge or lesions, no CMT  Thin prep pap is done without HR HPV cotesting  UTERUS: uterus is felt to be normal size, shape, consistency and nontender   ADNEXA: No adnexal masses or tenderness noted.  Extremities:  No swelling or varicosities noted  Chaperone present for exam  No results found for this or any previous visit (from the past 24 hours).  Assessment & Plan:  1. Encounter for annual routine gynecological examination (Primary) 2. Cervical cancer screening  - Cervical cancer screening: Discussed screening Q3 years. Reviewed importance of annual exams and limits of pap smear. Pap with reflex HPV completed today - GC/CT: Discussed and recommended. Pt  accepts - Gardasil: has not yet had. Will provide information - Birth Control: Unsure - Breast Health: Encouraged self breast awareness/exams.  - Mammogram: @ 29yo, or sooner if problems - Colonoscopy: @  29yo, or sooner if problems - Follow-up: 12 months and prn   3. Routine screening for STI (sexually transmitted infection) - Cervicovaginal ancillary only( [place]) - HIV antibody (with reflex) - RPR - Hepatitis C Antibody - Hepatitis B Surface AntiGEN   No orders of the defined types were placed in this encounter.  Meds: No orders of the defined types were placed in this encounter.  Follow-up: No follow-ups on  file.  Mia Mccoy, [place] 11/12/2023 2:46 PM

## 2023-11-13 ENCOUNTER — Other Ambulatory Visit (HOSPITAL_COMMUNITY)
Admission: RE | Admit: 2023-11-13 | Discharge: 2023-11-13 | Disposition: A | Discharge status: Home / Self Care | Source: Ambulatory Visit | Attending: Physician Assistant | Admitting: Physician Assistant

## 2023-11-13 ENCOUNTER — Ambulatory Visit (INDEPENDENT_AMBULATORY_CARE_PROVIDER_SITE_OTHER): Payer: 59 | Admitting: Physician Assistant

## 2023-11-13 ENCOUNTER — Encounter: Payer: Self-pay | Admitting: Physician Assistant

## 2023-11-13 VITALS — BP 118/74 | HR 92 | Ht 59.0 in | Wt 169.0 lb

## 2023-11-13 DIAGNOSIS — Z124 Encounter for screening for malignant neoplasm of cervix: Secondary | ICD-10-CM

## 2023-11-13 DIAGNOSIS — Z01419 Encounter for gynecological examination (general) (routine) without abnormal findings: Secondary | ICD-10-CM | POA: Diagnosis present

## 2023-11-13 DIAGNOSIS — Z113 Encounter for screening for infections with a predominantly sexual mode of transmission: Secondary | ICD-10-CM | POA: Insufficient documentation

## 2023-11-13 NOTE — Progress Notes (Signed)
 Pt presents for AEX Last PAP 2019 Requesting STD testing   Pt reports irregular periods since SAB 05/2023. Pt trying to conceive and has concerns about ovulation and fertility.

## 2023-11-16 ENCOUNTER — Other Ambulatory Visit

## 2023-11-16 LAB — CERVICOVAGINAL ANCILLARY ONLY
Chlamydia: NEGATIVE
Comment: NEGATIVE
Comment: NEGATIVE
Comment: NORMAL
Neisseria Gonorrhea: NEGATIVE
Trichomonas: NEGATIVE

## 2023-11-17 ENCOUNTER — Ambulatory Visit: Payer: 59 | Admitting: Advanced Practice Midwife

## 2023-11-18 ENCOUNTER — Encounter: Payer: Self-pay | Admitting: Physician Assistant

## 2023-11-18 LAB — HEPATITIS C ANTIBODY: Hep C Virus Ab: NONREACTIVE

## 2023-11-18 LAB — RPR: RPR Ser Ql: NONREACTIVE

## 2023-11-18 LAB — HEPATITIS B SURFACE ANTIGEN: Hepatitis B Surface Ag: NEGATIVE

## 2023-11-18 LAB — HIV ANTIBODY (ROUTINE TESTING W REFLEX): HIV Screen 4th Generation wRfx: NONREACTIVE

## 2023-11-20 LAB — CYTOLOGY - PAP: Diagnosis: NEGATIVE

## 2023-11-21 ENCOUNTER — Other Ambulatory Visit: Payer: Self-pay | Admitting: Physician Assistant

## 2023-11-21 DIAGNOSIS — B3731 Acute candidiasis of vulva and vagina: Secondary | ICD-10-CM

## 2023-11-21 MED ORDER — FLUCONAZOLE 150 MG PO TABS: 150.0000 mg | ORAL_TABLET | Freq: Once | ORAL | 0 refills | Status: AC

## 2024-02-11 ENCOUNTER — Other Ambulatory Visit: Payer: Self-pay

## 2024-02-11 ENCOUNTER — Ambulatory Visit (INDEPENDENT_AMBULATORY_CARE_PROVIDER_SITE_OTHER): Payer: Self-pay | Admitting: *Deleted

## 2024-02-11 ENCOUNTER — Other Ambulatory Visit (HOSPITAL_COMMUNITY)
Admission: RE | Admit: 2024-02-11 | Discharge: 2024-02-11 | Disposition: A | Discharge status: Home / Self Care | Source: Ambulatory Visit | Attending: Obstetrics & Gynecology | Admitting: Obstetrics & Gynecology

## 2024-02-11 VITALS — BP 113/77 | HR 72 | Wt 158.3 lb

## 2024-02-11 DIAGNOSIS — Z3A01 Less than 8 weeks gestation of pregnancy: Secondary | ICD-10-CM

## 2024-02-11 DIAGNOSIS — O0991 Supervision of high risk pregnancy, unspecified, first trimester: Secondary | ICD-10-CM

## 2024-02-11 DIAGNOSIS — O0990 Supervision of high risk pregnancy, unspecified, unspecified trimester: Secondary | ICD-10-CM | POA: Diagnosis present

## 2024-02-11 DIAGNOSIS — O3680X0 Pregnancy with inconclusive fetal viability, not applicable or unspecified: Secondary | ICD-10-CM

## 2024-02-11 DIAGNOSIS — Z1331 Encounter for screening for depression: Secondary | ICD-10-CM

## 2024-02-11 MED ORDER — BLOOD PRESSURE KIT DEVI: 1.0000 | 0 refills | Status: AC

## 2024-02-11 NOTE — Patient Instructions (Signed)

## 2024-02-11 NOTE — Progress Notes (Signed)
 New OB Intake  I connected with Mia Mccoy  on 02/11/24 at  2:10 PM EDT by In Person Visit and verified that I am speaking with the correct person using two identifiers. Nurse is located at [facility] and pt is located at [place].  I discussed the limitations, risks, security and privacy concerns of performing an evaluation and management service by telephone and the availability of in person appointments. I also discussed with the patient that there may be a patient responsible charge related to this service. The patient expressed understanding and agreed to proceed.  I explained I am completing New OB Intake today. We discussed EDD of 12/28/2023, by Last Menstrual Period. Pt is G2P1011. I reviewed her allergies, medications and Medical/Surgical/OB history.    There are no active problems to display for this patient.    Concerns addressed today  Delivery Plans Plans to deliver at [facility]. Discussed the nature of our practice with multiple providers including residents and students. Due to the size of the practice, the delivering provider may not be the same as those providing prenatal care.   Patient is not a candidate for water  birth.  MyChart/Babyscripts MyChart access verified. I explained pt will have some visits in office and some virtually. Babyscripts instructions given and order placed. Patient verifies receipt of registration text/e-mail. Account successfully created and app downloaded. If patient is a candidate for Optimized scheduling, add to sticky note.   Blood Pressure Cuff/Weight Scale Blood pressure cuff ordered for patient to pick-up from [employer]. Explained after first prenatal appt pt will check weekly and document in Babyscripts. Patient does not have weight scale; patient may purchase if they desire to track weight weekly in Babyscripts.  Anatomy US  Explained first scheduled US  will be around 19 weeks. Anatomy US  scheduled for TBD at TBD.  Is patient a  candidate for Babyscripts Optimization? No, due to Risk Factors   First visit review I reviewed new OB appt with patient. Explained pt will be seen by Kirstie Percy, PA at first visit. Discussed [place] genetic screening with patient. Requests Panorama. Routine prenatal labs OB Urine, GC/CC only collected at today's visit. Initial OB labs deferred to New OB appt.   Last Pap Diagnosis  Date Value Ref Range Status  11/13/2023   Final   - Negative for intraepithelial lesion or malignancy (NILM)    Donette Furlong, RN 02/11/2024  2:32 PM

## 2024-02-12 LAB — CERVICOVAGINAL ANCILLARY ONLY
Bacterial Vaginitis (gardnerella): POSITIVE — AB
Candida Glabrata: NEGATIVE
Candida Vaginitis: POSITIVE — AB
Chlamydia: NEGATIVE
Comment: NEGATIVE
Comment: NEGATIVE
Comment: NEGATIVE
Comment: NEGATIVE
Comment: NEGATIVE
Comment: NORMAL
Neisseria Gonorrhea: NEGATIVE
Trichomonas: NEGATIVE

## 2024-02-15 ENCOUNTER — Other Ambulatory Visit: Payer: Self-pay

## 2024-02-15 MED ORDER — TERCONAZOLE 0.8 % VA CREA: 1.0000 | TOPICAL_CREAM | Freq: Every day | VAGINAL | 0 refills | Status: DC

## 2024-02-15 MED ORDER — METRONIDAZOLE 500 MG PO TABS: 500.0000 mg | ORAL_TABLET | Freq: Two times a day (BID) | ORAL | 0 refills | Status: DC

## 2024-02-26 ENCOUNTER — Other Ambulatory Visit: Payer: Self-pay

## 2024-02-26 MED ORDER — TERCONAZOLE 0.8 % VA CREA: 1.0000 | TOPICAL_CREAM | Freq: Every day | VAGINAL | 0 refills | Status: DC

## 2024-02-26 NOTE — Progress Notes (Signed)
 Returned call, pt states that she [facility] but [facility]  [facility]. Pt states that she is on last of of flagyl , rx for terconazole  sent to appropriate pharmacy per protocol.

## 2024-03-06 ENCOUNTER — Encounter: Payer: Self-pay | Admitting: Physician Assistant

## 2024-03-06 NOTE — Progress Notes (Unsigned)
   PRENATAL VISIT NOTE  Subjective:  Mia Mccoy is a 28 y.o. G3P1011 at [redacted]w[redacted]d being seen today for her first prenatal visit for this pregnancy.  She is currently monitored for the following issues for this {Blank single:19197::high-risk,low-risk} pregnancy and has Supervision of high risk pregnancy, antepartum on their problem list.  Patient reports {sx:14538}.   .  .   . Denies leaking of fluid.   She is planning to {Blank single:19197::breastfeed,bottle feed}. Desires *** for contraception.   The following portions of the patient's history were reviewed and updated as appropriate: allergies, current medications, past family history, past medical history, past social history, past surgical history and problem list.   Objective:  There were no vitals filed for this visit.  Fetal Status:           General:  Alert, oriented and cooperative. Patient is in no acute distress.  Skin: Skin is warm and dry. No rash noted.   Cardiovascular: Normal heart rate and rhythm noted  Respiratory: Normal respiratory effort, no problems with respiration noted. Clear to auscultation.   Abdomen: Soft, gravid, appropriate for gestational age. Normal bowel sounds. Non-tender.       Pelvic: Cervical exam deferred       Normal cervical contour, no lesions, no bleeding following pap, normal discharge  Extremities: Normal range of motion.     Mental Status: Normal mood and affect. Normal behavior. Normal judgment and thought content.    Indications for ASA therapy (per uptodate) One of the following: Previous pregnancy with preeclampsia, especially early onset and with an adverse outcome {yes/no:20286} Multifetal gestation {yes/no:20286} Chronic hypertension {yes/no:20286} Type 1 or 2 diabetes mellitus {yes/no:20286} Chronic kidney disease {yes/no:20286} Autoimmune disease (antiphospholipid syndrome, systemic lupus erythematosus) {yes/no:20286}  Two or more of the following: Nulliparity  No Obesity (body mass index >30 kg/m2) {yes/no:20286} Family history of preeclampsia in mother or sister {yes/no:20286} Age >=35 years No Sociodemographic characteristics (African American race, low socioeconomic level) Yes Personal risk factors (eg, previous pregnancy with low birth weight or small for gestational age infant, previous adverse pregnancy outcome [eg, stillbirth], interval >10 years between pregnancies) {yes/no:20286}   Assessment and Plan:  Pregnancy: G3P1011 at [redacted]w[redacted]d  1. Supervision of high risk pregnancy, antepartum (Primary) Initial labs drawn. Continue prenatal vitamins. Genetic Screening discussed: NIPS, carrier screening and AFP *** Ultrasound discussed; fetal anatomic survey: *** Problem list reviewed and updated. Reviewed Brx optimized schedule, patient agreeable The nature of [facility] with multiple MDs and other Advanced Practice Providers was explained to patient; also emphasized that residents, students are part of our team. Routine obstetric precautions reviewed.   2. [redacted] weeks gestation of pregnancy ***  3. Thyroid  disorder ***  4. History of cesarean delivery G1, 2020 due to ***    Preterm labor/first trimester warning symptoms and general obstetric precautions including but not limited to vaginal bleeding, contractions, leaking of fluid and fetal movement were reviewed in detail with the patient. Please refer to After Visit Summary for other counseling recommendations.   No follow-ups on file.  Future Appointments  Date Time Provider Department Center  03/10/2024  1:30 PM Verneta Hamidi E, PA-C [facility] None    Toshio Slusher E Marrio Scribner, PA-C

## 2024-03-10 ENCOUNTER — Ambulatory Visit (INDEPENDENT_AMBULATORY_CARE_PROVIDER_SITE_OTHER): Payer: Self-pay | Admitting: Physician Assistant

## 2024-03-10 ENCOUNTER — Other Ambulatory Visit (HOSPITAL_COMMUNITY)
Admission: RE | Admit: 2024-03-10 | Discharge: 2024-03-10 | Disposition: A | Discharge status: Home / Self Care | Source: Ambulatory Visit | Attending: Physician Assistant | Admitting: Physician Assistant

## 2024-03-10 VITALS — BP 117/75 | HR 86 | Wt 162.8 lb

## 2024-03-10 DIAGNOSIS — E079 Disorder of thyroid, unspecified: Secondary | ICD-10-CM

## 2024-03-10 DIAGNOSIS — Z98891 History of uterine scar from previous surgery: Secondary | ICD-10-CM

## 2024-03-10 DIAGNOSIS — Z3A11 11 weeks gestation of pregnancy: Secondary | ICD-10-CM | POA: Insufficient documentation

## 2024-03-10 DIAGNOSIS — O0991 Supervision of high risk pregnancy, unspecified, first trimester: Secondary | ICD-10-CM

## 2024-03-10 DIAGNOSIS — O0990 Supervision of high risk pregnancy, unspecified, unspecified trimester: Secondary | ICD-10-CM | POA: Insufficient documentation

## 2024-03-10 DIAGNOSIS — Z1331 Encounter for screening for depression: Secondary | ICD-10-CM

## 2024-03-10 NOTE — Progress Notes (Signed)
 Pt presents for new OB. Pt taking OTC prenatals. Not taking BP at home. Will pick up cuff form [facility]. No other questions or concerns.

## 2024-03-11 LAB — CBC/D/PLT+RPR+RH+ABO+RUBIGG...
Antibody Screen: NEGATIVE
Basophils Absolute: 0 x10E3/uL (ref 0.0–0.2)
Basos: 0 %
EOS (ABSOLUTE): 0 x10E3/uL (ref 0.0–0.4)
Eos: 1 %
HCV Ab: NONREACTIVE
HIV Screen 4th Generation wRfx: NONREACTIVE
Hematocrit: 42.9 % (ref 34.0–46.6)
Hemoglobin: 13.2 g/dL (ref 11.1–15.9)
Hepatitis B Surface Ag: NEGATIVE
Immature Grans (Abs): 0 x10E3/uL (ref 0.0–0.1)
Immature Granulocytes: 0 %
Lymphocytes Absolute: 1.9 x10E3/uL (ref 0.7–3.1)
Lymphs: 32 %
MCH: 26.2 pg — ABNORMAL LOW (ref 26.6–33.0)
MCHC: 30.8 g/dL — ABNORMAL LOW (ref 31.5–35.7)
MCV: 85 fL (ref 79–97)
Monocytes Absolute: 0.5 x10E3/uL (ref 0.1–0.9)
Monocytes: 8 %
Neutrophils Absolute: 3.6 x10E3/uL (ref 1.4–7.0)
Neutrophils: 59 %
Platelets: 321 x10E3/uL (ref 150–450)
RBC: 5.04 x10E6/uL (ref 3.77–5.28)
RDW: 13.1 % (ref 11.7–15.4)
RPR Ser Ql: NONREACTIVE
Rh Factor: POSITIVE
Rubella Antibodies, IGG: 10.6 {index} (ref >0.99)
WBC: 6.1 x10E3/uL (ref 3.4–10.8)

## 2024-03-11 LAB — T4, FREE: Free T4: 1.01 ng/dL (ref 0.82–1.77)

## 2024-03-11 LAB — TSH: TSH: 0.387 u[IU]/mL — ABNORMAL LOW (ref 0.450–4.500)

## 2024-03-11 LAB — HEMOGLOBIN A1C
Est. average glucose Bld gHb Est-mCnc: 114 mg/dL
Hgb A1c MFr Bld: 5.6 % (ref 4.8–5.6)

## 2024-03-11 LAB — HCV INTERPRETATION

## 2024-03-14 ENCOUNTER — Encounter: Payer: Self-pay | Admitting: Internal Medicine

## 2024-03-15 ENCOUNTER — Encounter: Payer: Self-pay | Admitting: Physician Assistant

## 2024-03-15 ENCOUNTER — Other Ambulatory Visit: Payer: Self-pay | Admitting: Physician Assistant

## 2024-03-15 ENCOUNTER — Ambulatory Visit: Payer: Self-pay | Admitting: Physician Assistant

## 2024-03-15 ENCOUNTER — Other Ambulatory Visit: Payer: Self-pay

## 2024-03-15 DIAGNOSIS — R7989 Other specified abnormal findings of blood chemistry: Secondary | ICD-10-CM

## 2024-03-15 LAB — CERVICOVAGINAL ANCILLARY ONLY
Bacterial Vaginitis (gardnerella): POSITIVE — AB
Comment: NEGATIVE

## 2024-03-15 MED ORDER — METRONIDAZOLE 500 MG PO TABS: 500.0000 mg | ORAL_TABLET | Freq: Two times a day (BID) | ORAL | 0 refills | Status: DC

## 2024-03-16 LAB — PANORAMA PRENATAL TEST FULL PANEL:PANORAMA TEST PLUS 5 ADDITIONAL MICRODELETIONS: FETAL FRACTION: 12.6

## 2024-03-19 ENCOUNTER — Encounter: Payer: Self-pay | Admitting: Physician Assistant

## 2024-03-24 ENCOUNTER — Encounter: Payer: Self-pay | Admitting: Physician Assistant

## 2024-03-24 LAB — HORIZON CUSTOM: REPORT SUMMARY: POSITIVE — AB

## 2024-03-25 ENCOUNTER — Encounter: Payer: Self-pay | Admitting: Physician Assistant

## 2024-03-25 DIAGNOSIS — D563 Thalassemia minor: Secondary | ICD-10-CM | POA: Insufficient documentation

## 2024-04-07 ENCOUNTER — Ambulatory Visit (INDEPENDENT_AMBULATORY_CARE_PROVIDER_SITE_OTHER): Payer: Self-pay | Admitting: Physician Assistant

## 2024-04-07 VITALS — BP 108/71 | HR 94 | Wt 164.8 lb

## 2024-04-07 DIAGNOSIS — O0990 Supervision of high risk pregnancy, unspecified, unspecified trimester: Secondary | ICD-10-CM

## 2024-04-07 DIAGNOSIS — Z3A15 15 weeks gestation of pregnancy: Secondary | ICD-10-CM

## 2024-04-07 DIAGNOSIS — O0992 Supervision of high risk pregnancy, unspecified, second trimester: Secondary | ICD-10-CM

## 2024-04-07 NOTE — Progress Notes (Signed)
 Pt presents for rob. Pt has no questions or concerns at this time.

## 2024-04-07 NOTE — Progress Notes (Signed)
   PRENATAL VISIT NOTE  Subjective:  Mia Mccoy is a 28 y.o. G3P1011 at [redacted]w[redacted]d being seen today for ongoing prenatal care.  She is currently monitored for the following issues for this low-risk pregnancy and has Supervision of high risk pregnancy, antepartum and Alpha thalassemia silent carrier on their problem list.  Patient reports no complaints.  Contractions: Not present. Vag. Bleeding: None.  Movement: Present. Denies leaking of fluid.   The following portions of the patient's history were reviewed and updated as appropriate: allergies, current medications, past family history, past medical history, past social history, past surgical history and problem list.   Objective:    Vitals:   04/07/24 0924  BP: 108/71  Pulse: 94  Weight: 164 lb 12.8 oz (74.8 kg)    Fetal Status:  Fetal Heart Rate (bpm): 148 Fundal Height: 13 cm Movement: Present    General: Alert, oriented and cooperative. Patient is in no acute distress.  Skin: Skin is warm and dry. No rash noted.   Cardiovascular: Normal heart rate noted  Respiratory: Normal respiratory effort, no problems with respiration noted  Abdomen: Soft, gravid, appropriate for gestational age.  Pain/Pressure: Absent     Pelvic: Cervical exam deferred        Extremities: Normal range of motion.  Edema: None  Mental Status: Normal mood and affect. Normal behavior. Normal judgment and thought content.   Assessment and Plan:  Pregnancy: G3P1011 at [redacted]w[redacted]d  1. Supervision of high risk pregnancy, antepartum (Primary) Patient doing well, feeling regular fetal movement  BP, FHR, FH appropriate Reviewed new OB labs  2. [redacted] weeks gestation of pregnancy Anticipatory guidance about next visits/weeks of pregnancy given.  -AFP today  Preterm labor symptoms and general obstetric precautions including but not limited to vaginal bleeding, contractions, leaking of fluid and fetal movement were reviewed in detail with the patient.  Please refer to  After Visit Summary for other counseling recommendations.   Return in about 4 weeks (around 05/05/2024) for LOB.  No future appointments.   Adasyn Mcadams E Kareli Hossain, PA-C

## 2024-04-09 LAB — AFP, SERUM, OPEN SPINA BIFIDA
AFP MoM: 1.67
AFP Value: 50.2 ng/mL
Gest. Age on Collection Date: 15 wk
Maternal Age At EDD: 29.5 a
OSBR Risk 1 IN: 3514
Test Results:: NEGATIVE
Weight: 164 [lb_av]

## 2024-04-10 ENCOUNTER — Ambulatory Visit: Payer: Self-pay | Admitting: Physician Assistant

## 2024-05-05 ENCOUNTER — Encounter: Payer: Self-pay | Admitting: Obstetrics and Gynecology

## 2024-05-05 ENCOUNTER — Other Ambulatory Visit (HOSPITAL_COMMUNITY)
Admission: RE | Admit: 2024-05-05 | Discharge: 2024-05-05 | Disposition: A | Discharge status: Home / Self Care | Source: Ambulatory Visit | Attending: Obstetrics and Gynecology | Admitting: Obstetrics and Gynecology

## 2024-05-05 ENCOUNTER — Ambulatory Visit (INDEPENDENT_AMBULATORY_CARE_PROVIDER_SITE_OTHER): Payer: Self-pay | Admitting: Obstetrics and Gynecology

## 2024-05-05 VITALS — BP 105/63 | HR 92 | Wt 171.8 lb

## 2024-05-05 DIAGNOSIS — O0992 Supervision of high risk pregnancy, unspecified, second trimester: Secondary | ICD-10-CM

## 2024-05-05 DIAGNOSIS — N898 Other specified noninflammatory disorders of vagina: Secondary | ICD-10-CM | POA: Insufficient documentation

## 2024-05-05 DIAGNOSIS — Z98891 History of uterine scar from previous surgery: Secondary | ICD-10-CM

## 2024-05-05 DIAGNOSIS — Z3A19 19 weeks gestation of pregnancy: Secondary | ICD-10-CM

## 2024-05-05 DIAGNOSIS — O0990 Supervision of high risk pregnancy, unspecified, unspecified trimester: Secondary | ICD-10-CM

## 2024-05-05 NOTE — Progress Notes (Signed)
   PRENATAL VISIT NOTE  Subjective:  Mia Mccoy is a 28 y.o. G3P1011 at [redacted]w[redacted]d being seen today for ongoing prenatal care.  She is currently monitored for the following issues for this low-risk pregnancy and has Supervision of high risk pregnancy, antepartum and Alpha thalassemia silent carrier on their problem list.  Patient reports vaginal discharge and odor, has previously been treated for BV  Contractions: Not present. Vag. Bleeding: None.  Movement: Present. Denies leaking of fluid.   The following portions of the patient's history were reviewed and updated as appropriate: allergies, current medications, past family history, past medical history, past social history, past surgical history and problem list.   Objective:    Vitals:   05/05/24 0933  BP: 105/63  Pulse: 92  Weight: 171 lb 12.8 oz (77.9 kg)    Fetal Status:  Fetal Heart Rate (bpm): 144   Movement: Present    General: Alert, oriented and cooperative. Patient is in no acute distress.  Skin: Skin is warm and dry. No rash noted.   Cardiovascular: Normal heart rate noted  Respiratory: Normal respiratory effort, no problems with respiration noted  Abdomen: Soft, gravid, appropriate for gestational age.  Pain/Pressure: Absent     Pelvic: Cervical exam deferred        Extremities: Normal range of motion.  Edema: None  Mental Status: Normal mood and affect. Normal behavior. Normal judgment and thought content.   Assessment and Plan:  Pregnancy: G3P1011 at [redacted]w[redacted]d 1. Supervision of high risk pregnancy, antepartum (Primary) BP and FHR normal Doing well, feeling regular movement    2. [redacted] weeks gestation of pregnancy Anatomy scan 9/12  3. Vaginal discharge 4. Vaginal odor  - Cervicovaginal ancillary only  5. History of cesarean section Desires TOLAC   Preterm labor symptoms and general obstetric precautions including but not limited to vaginal bleeding, contractions, leaking of fluid and fetal movement were  reviewed in detail with the patient. Please refer to After Visit Summary for other counseling recommendations.   Return in about 4 weeks (around 06/02/2024) for OB VISIT (MD or APP).  Future Appointments  Date Time Provider Department Center  05/20/2024  2:00 PM [facility] PROVIDER 1 [facility]  05/20/2024  2:30 PM [facility] US2 [facility]    Nidia Daring, FNP

## 2024-05-05 NOTE — Progress Notes (Signed)
 ROB. Pt states everything is going well.

## 2024-05-06 LAB — CERVICOVAGINAL ANCILLARY ONLY
Bacterial Vaginitis (gardnerella): POSITIVE — AB
Candida Glabrata: NEGATIVE
Candida Vaginitis: POSITIVE — AB
Comment: NEGATIVE
Comment: NEGATIVE
Comment: NEGATIVE

## 2024-05-07 ENCOUNTER — Ambulatory Visit: Payer: Self-pay | Admitting: Obstetrics and Gynecology

## 2024-05-07 MED ORDER — METRONIDAZOLE 0.75 % VA GEL
1.0000 | Freq: Every day | VAGINAL | 0 refills | Status: DC
Start: 2024-05-07 — End: 2024-06-10

## 2024-05-07 MED ORDER — FLUCONAZOLE 150 MG PO TABS: 150.0000 mg | ORAL_TABLET | Freq: Once | ORAL | 0 refills | Status: AC

## 2024-05-20 ENCOUNTER — Ambulatory Visit (HOSPITAL_BASED_OUTPATIENT_CLINIC_OR_DEPARTMENT_OTHER): Payer: Self-pay

## 2024-05-20 ENCOUNTER — Other Ambulatory Visit: Payer: Self-pay | Admitting: *Deleted

## 2024-05-20 ENCOUNTER — Ambulatory Visit: Payer: Self-pay | Attending: Obstetrics and Gynecology | Admitting: Obstetrics

## 2024-05-20 DIAGNOSIS — E669 Obesity, unspecified: Secondary | ICD-10-CM | POA: Diagnosis not present

## 2024-05-20 DIAGNOSIS — O358XX0 Maternal care for other (suspected) fetal abnormality and damage, not applicable or unspecified: Secondary | ICD-10-CM

## 2024-05-20 DIAGNOSIS — O34219 Maternal care for unspecified type scar from previous cesarean delivery: Secondary | ICD-10-CM

## 2024-05-20 DIAGNOSIS — Z3A21 21 weeks gestation of pregnancy: Secondary | ICD-10-CM

## 2024-05-20 DIAGNOSIS — O99212 Obesity complicating pregnancy, second trimester: Secondary | ICD-10-CM

## 2024-05-20 DIAGNOSIS — O0990 Supervision of high risk pregnancy, unspecified, unspecified trimester: Secondary | ICD-10-CM

## 2024-05-20 DIAGNOSIS — Z148 Genetic carrier of other disease: Secondary | ICD-10-CM | POA: Insufficient documentation

## 2024-05-20 DIAGNOSIS — Z369 Encounter for antenatal screening, unspecified: Secondary | ICD-10-CM | POA: Insufficient documentation

## 2024-05-20 NOTE — Progress Notes (Signed)
 MFM Consult Note  Mia Mccoy is currently at 21 weeks and 6 days.  She was seen due to maternal obesity with a BMI of 32.    She denies any problems in her current pregnancy.  She had a cell free DNA test earlier in her pregnancy which indicated a low risk for trisomy 27, 92, and 13. A female fetus is predicted.   Sonographic findings Single intrauterine pregnancy at 21w 6d  Fetal cardiac activity:  Observed and appears normal. Presentation: Breech. The anatomic structures that were well seen appear normal without evidence of soft markers. Due to poor acoustic windows some structures remain suboptimally visualized. Fetal biometry shows the estimated fetal weight at the 21 percentile.  Amniotic fluid: Within normal limits.  MVP: 4.1 cm. Placenta: Anterior. Adnexa: No abnormality visualized. Cervical length: 4.2 cm.  The views of the fetal anatomy were limited today due to the fetal position.  The patient was informed that anomalies may be missed due to technical limitations. If the fetus is in a suboptimal position or maternal habitus is increased, visualization of the fetus in the maternal uterus may be impaired.  A follow-up exam was scheduled in 4 weeks to complete the views of the fetal anatomy and to assess the fetal growth.    The patient stated that all of her questions were answered today.  A total of 30 minutes was spent counseling and coordinating the care for this patient.  Greater than 50% of the time was spent in direct face-to-face contact.

## 2024-05-29 ENCOUNTER — Encounter: Payer: Self-pay | Admitting: Physician Assistant

## 2024-06-02 ENCOUNTER — Encounter: Admitting: Obstetrics and Gynecology

## 2024-06-02 DIAGNOSIS — O0990 Supervision of high risk pregnancy, unspecified, unspecified trimester: Secondary | ICD-10-CM

## 2024-06-02 DIAGNOSIS — Z3A23 23 weeks gestation of pregnancy: Secondary | ICD-10-CM

## 2024-06-10 ENCOUNTER — Ambulatory Visit (INDEPENDENT_AMBULATORY_CARE_PROVIDER_SITE_OTHER): Payer: Self-pay | Admitting: Obstetrics and Gynecology

## 2024-06-10 ENCOUNTER — Other Ambulatory Visit (HOSPITAL_COMMUNITY)
Admission: RE | Admit: 2024-06-10 | Discharge: 2024-06-10 | Disposition: A | Source: Ambulatory Visit | Attending: Obstetrics and Gynecology | Admitting: Obstetrics and Gynecology

## 2024-06-10 ENCOUNTER — Encounter: Payer: Self-pay | Admitting: Obstetrics and Gynecology

## 2024-06-10 VITALS — BP 109/70 | HR 104 | Wt 183.7 lb

## 2024-06-10 DIAGNOSIS — Z98891 History of uterine scar from previous surgery: Secondary | ICD-10-CM

## 2024-06-10 DIAGNOSIS — N898 Other specified noninflammatory disorders of vagina: Secondary | ICD-10-CM | POA: Diagnosis present

## 2024-06-10 DIAGNOSIS — O0990 Supervision of high risk pregnancy, unspecified, unspecified trimester: Secondary | ICD-10-CM

## 2024-06-10 DIAGNOSIS — Z3A24 24 weeks gestation of pregnancy: Secondary | ICD-10-CM

## 2024-06-10 NOTE — Progress Notes (Signed)
 Pt dc; yellow, chunky, slight odor. Concern for yeast infection

## 2024-06-10 NOTE — Progress Notes (Signed)
   PRENATAL VISIT NOTE  Subjective:  Mia Mccoy is a 28 y.o. G3P1011 at [redacted]w[redacted]d being seen today for ongoing prenatal care.  She is currently monitored for the following issues for this high-risk pregnancy and has Supervision of high risk pregnancy, antepartum; Alpha thalassemia silent carrier; and History of cesarean section on their problem list.  Patient reports thick white/yellow vaginal d/c with vaginal odor.  Contractions: Not present. Vag. Bleeding: None.  Movement: Present. Denies leaking of fluid.   The following portions of the patient's history were reviewed and updated as appropriate: allergies, current medications, past family history, past medical history, past social history, past surgical history and problem list.   Objective:    Vitals:   06/10/24 1103  BP: 109/70  Pulse: (!) 104  Weight: 183 lb 11.2 oz (83.3 kg)    Fetal Status:  Fetal Heart Rate (bpm): 138 Fundal Height: 25 cm Movement: Present    General: Alert, oriented and cooperative. Patient is in no acute distress.  Skin: Skin is warm and dry. No rash noted.   Cardiovascular: Normal heart rate noted  Respiratory: Normal respiratory effort, no problems with respiration noted  Abdomen: Soft, gravid, appropriate for gestational age.  Pain/Pressure: Absent     Pelvic: Cervical exam deferred        Extremities: Normal range of motion.  Edema: None  Mental Status: Normal mood and affect. Normal behavior. Normal judgment and thought content.   Assessment and Plan:  Pregnancy: G3P1011 at [redacted]w[redacted]d 1. Supervision of high risk pregnancy, antepartum (Primary) BP and FHR normal Doing well, feeling regular movement  FH appropriate   2. [redacted] weeks gestation of pregnancy -Anticipatory guidance regarding GTT and labs next visit, discussed NPO status after midnight    3. History of cesarean section Desires TOLAC   4. Vaginal discharge 5. Vaginal odor Swab collected today    Preterm labor symptoms and general  obstetric precautions including but not limited to vaginal bleeding, contractions, leaking of fluid and fetal movement were reviewed in detail with the patient. Please refer to After Visit Summary for other counseling recommendations.   Return in about 4 weeks (around 07/08/2024) for OB VISIT (MD or APP), 2 hr GTT.  Future Appointments  Date Time Provider Department Center  06/17/2024 11:15 AM [facility] PROVIDER 1 [facility]  06/17/2024 11:30 AM [facility] US1 [facility]  08/17/2024  2:20 PM [place], [place], MD [facility] None    Nidia Daring, FNP

## 2024-06-13 ENCOUNTER — Ambulatory Visit: Payer: Self-pay | Admitting: Obstetrics and Gynecology

## 2024-06-13 ENCOUNTER — Encounter: Payer: Self-pay | Admitting: Physician Assistant

## 2024-06-13 LAB — CERVICOVAGINAL ANCILLARY ONLY
Bacterial Vaginitis (gardnerella): POSITIVE — AB
Candida Glabrata: NEGATIVE
Candida Vaginitis: POSITIVE — AB
Comment: NEGATIVE
Comment: NEGATIVE
Comment: NEGATIVE

## 2024-06-13 MED ORDER — METRONIDAZOLE 0.75 % VA GEL: 1.0000 | Freq: Every day | VAGINAL | 0 refills | Status: DC

## 2024-06-13 MED ORDER — FLUCONAZOLE 150 MG PO TABS: 150.0000 mg | ORAL_TABLET | Freq: Once | ORAL | 1 refills | Status: AC

## 2024-06-17 ENCOUNTER — Ambulatory Visit (HOSPITAL_BASED_OUTPATIENT_CLINIC_OR_DEPARTMENT_OTHER)

## 2024-06-17 ENCOUNTER — Other Ambulatory Visit: Payer: Self-pay | Admitting: Obstetrics

## 2024-06-17 ENCOUNTER — Ambulatory Visit: Attending: Obstetrics | Admitting: Obstetrics

## 2024-06-17 ENCOUNTER — Other Ambulatory Visit: Payer: Self-pay | Admitting: *Deleted

## 2024-06-17 VITALS — BP 103/67

## 2024-06-17 DIAGNOSIS — O99212 Obesity complicating pregnancy, second trimester: Secondary | ICD-10-CM

## 2024-06-17 DIAGNOSIS — O358XX0 Maternal care for other (suspected) fetal abnormality and damage, not applicable or unspecified: Secondary | ICD-10-CM | POA: Diagnosis present

## 2024-06-17 DIAGNOSIS — Z3A25 25 weeks gestation of pregnancy: Secondary | ICD-10-CM | POA: Diagnosis not present

## 2024-06-17 DIAGNOSIS — O99012 Anemia complicating pregnancy, second trimester: Secondary | ICD-10-CM | POA: Diagnosis not present

## 2024-06-17 DIAGNOSIS — Z148 Genetic carrier of other disease: Secondary | ICD-10-CM | POA: Insufficient documentation

## 2024-06-17 DIAGNOSIS — O365920 Maternal care for other known or suspected poor fetal growth, second trimester, not applicable or unspecified: Secondary | ICD-10-CM | POA: Diagnosis not present

## 2024-06-17 DIAGNOSIS — D563 Thalassemia minor: Secondary | ICD-10-CM | POA: Diagnosis not present

## 2024-06-17 DIAGNOSIS — E669 Obesity, unspecified: Secondary | ICD-10-CM

## 2024-06-17 DIAGNOSIS — O34219 Maternal care for unspecified type scar from previous cesarean delivery: Secondary | ICD-10-CM

## 2024-06-17 DIAGNOSIS — Z362 Encounter for other antenatal screening follow-up: Secondary | ICD-10-CM | POA: Insufficient documentation

## 2024-06-17 DIAGNOSIS — O0990 Supervision of high risk pregnancy, unspecified, unspecified trimester: Secondary | ICD-10-CM

## 2024-06-17 NOTE — Progress Notes (Signed)
 MFM Consult Note  Mia Mccoy is currently at 25 weeks and 6 days.  She was seen as the views of the fetal anatomy were unable to be fully visualized during her prior exam.    She denies any problems since her last exam and reports feeling fetal movements throughout the day.    Sonographic findings Single intrauterine pregnancy at 25w 6d.  Fetal cardiac activity:  Observed and appears normal. Presentation: Cephalic. Fetal biometry shows the estimated fetal weight of 1 pound 9 ounces which measures at the 6th percentile, indicating IUGR. Amniotic fluid volume: Within normal limits. MVP: 5.22 cm. Placenta: Anterior.  The views of the fetal anatomy remain limited today due to the fetal position.  What was visualized today appeared within normal limits.    Fetal movements were noted throughout today's ultrasound exam.  Doppler studies of the umbilical arteries showed an elevated S/D ratio of 4.63 .  There were no signs of absent or reversed end-diastolic flow.    IUGR The patient was reassured that IUGR is a common finding.  Most cases of IUGR result in the delivery of a healthy infant at or close to term.    As the patient is only 4 foot 10 inches tall, she was advised that there is a high likelihood that her fetus is constitutionally small.  Due to IUGR, she will return in 2 weeks for an umbilical artery Doppler study and amniotic fluid check.  We will reassess the fetal growth in 3 weeks.    The patient understands that should IUGR continue to be noted later in her pregnancy, she may require weekly fetal testing and weekly ultrasounds for umbilical artery Doppler studies.  She understands that most cases of IUGR are delivered at between 37 to 38 weeks.    Fetal kick count instructions were reviewed.  The patient stated that all of her questions were answered today.  A total of 20 minutes was spent counseling and coordinating the care for this patient.  Greater than 50% of the  time was spent in direct face-to-face contact.

## 2024-06-29 DIAGNOSIS — O365990 Maternal care for other known or suspected poor fetal growth, unspecified trimester, not applicable or unspecified: Secondary | ICD-10-CM | POA: Insufficient documentation

## 2024-06-30 ENCOUNTER — Ambulatory Visit (HOSPITAL_BASED_OUTPATIENT_CLINIC_OR_DEPARTMENT_OTHER): Payer: Self-pay | Admitting: Maternal & Fetal Medicine

## 2024-06-30 ENCOUNTER — Ambulatory Visit: Payer: Self-pay | Attending: Obstetrics

## 2024-06-30 ENCOUNTER — Other Ambulatory Visit: Payer: Self-pay | Admitting: *Deleted

## 2024-06-30 VITALS — BP 130/63 | HR 100

## 2024-06-30 DIAGNOSIS — D563 Thalassemia minor: Secondary | ICD-10-CM | POA: Diagnosis not present

## 2024-06-30 DIAGNOSIS — Z3A27 27 weeks gestation of pregnancy: Secondary | ICD-10-CM | POA: Diagnosis not present

## 2024-06-30 DIAGNOSIS — O99212 Obesity complicating pregnancy, second trimester: Secondary | ICD-10-CM

## 2024-06-30 DIAGNOSIS — O365920 Maternal care for other known or suspected poor fetal growth, second trimester, not applicable or unspecified: Secondary | ICD-10-CM

## 2024-06-30 DIAGNOSIS — E669 Obesity, unspecified: Secondary | ICD-10-CM

## 2024-06-30 DIAGNOSIS — O0990 Supervision of high risk pregnancy, unspecified, unspecified trimester: Secondary | ICD-10-CM | POA: Diagnosis not present

## 2024-06-30 DIAGNOSIS — O99012 Anemia complicating pregnancy, second trimester: Secondary | ICD-10-CM | POA: Diagnosis not present

## 2024-06-30 DIAGNOSIS — Z98891 History of uterine scar from previous surgery: Secondary | ICD-10-CM | POA: Insufficient documentation

## 2024-07-01 NOTE — Progress Notes (Signed)
 After review, MFM consult with provider is not indicated for today  Mia Nathanel Pipe, MD 07/01/2024 12:56 PM  [facility]

## 2024-07-06 ENCOUNTER — Other Ambulatory Visit

## 2024-07-06 ENCOUNTER — Encounter: Payer: Self-pay | Admitting: Obstetrics and Gynecology

## 2024-07-07 ENCOUNTER — Ambulatory Visit (HOSPITAL_BASED_OUTPATIENT_CLINIC_OR_DEPARTMENT_OTHER): Payer: Self-pay

## 2024-07-07 ENCOUNTER — Ambulatory Visit: Payer: Self-pay | Attending: Obstetrics

## 2024-07-07 ENCOUNTER — Ambulatory Visit: Payer: Self-pay

## 2024-07-07 ENCOUNTER — Other Ambulatory Visit: Payer: Self-pay | Admitting: *Deleted

## 2024-07-07 ENCOUNTER — Ambulatory Visit (HOSPITAL_BASED_OUTPATIENT_CLINIC_OR_DEPARTMENT_OTHER): Payer: Self-pay | Admitting: Maternal & Fetal Medicine

## 2024-07-07 VITALS — BP 132/65 | HR 87

## 2024-07-07 DIAGNOSIS — E669 Obesity, unspecified: Secondary | ICD-10-CM

## 2024-07-07 DIAGNOSIS — D563 Thalassemia minor: Secondary | ICD-10-CM

## 2024-07-07 DIAGNOSIS — O0990 Supervision of high risk pregnancy, unspecified, unspecified trimester: Secondary | ICD-10-CM

## 2024-07-07 DIAGNOSIS — O365930 Maternal care for other known or suspected poor fetal growth, third trimester, not applicable or unspecified: Secondary | ICD-10-CM

## 2024-07-07 DIAGNOSIS — O99013 Anemia complicating pregnancy, third trimester: Secondary | ICD-10-CM

## 2024-07-07 DIAGNOSIS — Z3A28 28 weeks gestation of pregnancy: Secondary | ICD-10-CM

## 2024-07-07 DIAGNOSIS — O99212 Obesity complicating pregnancy, second trimester: Secondary | ICD-10-CM

## 2024-07-07 DIAGNOSIS — O365920 Maternal care for other known or suspected poor fetal growth, second trimester, not applicable or unspecified: Secondary | ICD-10-CM

## 2024-07-07 NOTE — Progress Notes (Signed)
   Patient information  Patient Name: Mia Mccoy  Patient MRN:   [id number]  Referring practice: [facility] Referring Provider: [facility]  Problem List   Patient Active Problem List   Diagnosis Date Noted   IUGR (intrauterine growth restriction) affecting care of mother (resolved at 28w) 06/29/2024   History of cesarean section 05/05/2024   Alpha thalassemia silent carrier 03/25/2024   Supervision of high risk pregnancy, antepartum 02/11/2024   Maternal Fetal medicine Consult  Mia Mccoy is a 28 y.o. G3P1011 at [redacted]w[redacted]d here for ultrasound and consultation. Mia Mccoy is doing well today with no acute concerns. Today we focused on the following:   The patient is here for fetal growth restriction that was previously at the 6 percentile and is now at the 14th percentile overall.  Umbilical artery Dopplers were normal today.  Due to the concern for possible decreased fulfillment of growth potential she will come back in 4 weeks for fetal assessment.  I discussed the possibility of growth restriction returning but future ultrasounds are needed to assess.  The patient had time to ask questions that were answered to her satisfaction.  She verbalized understanding and agrees to proceed with the plan below.  Sonographic findings Single intrauterine pregnancy at 28w 5d.  Fetal cardiac activity:  Observed and appears normal. Presentation: Cephalic. Interval fetal anatomy appears normal. Fetal biometry shows the estimated fetal weight at the 14 percentile. Amniotic fluid volume: Within normal limits. MVP: 5.51 cm. Placenta: Anterior.  There are limitations of prenatal ultrasound such as the inability to detect certain abnormalities due to poor visualization. Various factors such as fetal position, gestational age and maternal body habitus may increase the difficulty in visualizing the fetal anatomy.    Recommendations - Continue growth ultrasounds throughout the pregnancy as  long as the growth is near the 10 to 20th percentile or lower  Review of Systems: A review of systems was performed and was negative except per HPI   Vitals and Physical Exam    07/07/2024    2:00 PM 06/30/2024    2:48 PM 06/17/2024   11:24 AM  Vitals with BMI  Systolic 132 130 896  Diastolic 65 63 67  Pulse 87 100     Sitting comfortably on the sonogram table Nonlabored breathing Normal rate and rhythm Abdomen is nontender  Past pregnancies OB History  Gravida Para Term Preterm AB Living  3 1 1  0 1 1  SAB IAB Ectopic Multiple Live Births  1 0 0 0 1    # Outcome Date GA Lbr Len/2nd Weight Sex Type Anes PTL Lv  3 Current           2 SAB 05/2023          1 Term 02/07/19 [redacted]w[redacted]d  6 lb 12.6 oz (3.079 kg) F CS-LTranv EPI  LIV     I spent 20 minutes reviewing the patients chart, including labs and images as well as counseling the patient about her medical conditions. Greater than 50% of the time was spent in direct face-to-face patient counseling.  Delora Smaller  MFM, [place]   07/07/2024  3:45 PM

## 2024-07-07 NOTE — Procedures (Signed)
 Mia Mccoy 10/16/1995 [redacted]w[redacted]d  Fetus A Non-Stress Test Interpretation for 07/07/24  Indication: IUGR  Fetal Heart Rate A Mode: External Baseline Rate (A): 135 bpm Variability: Moderate Accelerations: 15 x 15 Decelerations: None Multiple birth?: No  Uterine Activity Mode: Palpation, Toco Contraction Frequency (min): none noted Resting Tone Palpated: Relaxed  Interpretation (Fetal Testing) Nonstress Test Interpretation: Reactive Comments: Reviewed with Dr. William

## 2024-07-13 ENCOUNTER — Other Ambulatory Visit: Payer: Self-pay

## 2024-07-13 ENCOUNTER — Ambulatory Visit (INDEPENDENT_AMBULATORY_CARE_PROVIDER_SITE_OTHER): Payer: Self-pay | Admitting: Obstetrics

## 2024-07-13 ENCOUNTER — Encounter: Payer: Self-pay | Admitting: Obstetrics

## 2024-07-13 VITALS — BP 109/63 | HR 80 | Wt 191.6 lb

## 2024-07-13 DIAGNOSIS — O0993 Supervision of high risk pregnancy, unspecified, third trimester: Secondary | ICD-10-CM

## 2024-07-13 DIAGNOSIS — O99213 Obesity complicating pregnancy, third trimester: Secondary | ICD-10-CM

## 2024-07-13 DIAGNOSIS — D563 Thalassemia minor: Secondary | ICD-10-CM

## 2024-07-13 DIAGNOSIS — Z3A29 29 weeks gestation of pregnancy: Secondary | ICD-10-CM

## 2024-07-13 DIAGNOSIS — O99210 Obesity complicating pregnancy, unspecified trimester: Secondary | ICD-10-CM

## 2024-07-13 DIAGNOSIS — Z98891 History of uterine scar from previous surgery: Secondary | ICD-10-CM

## 2024-07-13 DIAGNOSIS — O0990 Supervision of high risk pregnancy, unspecified, unspecified trimester: Secondary | ICD-10-CM

## 2024-07-13 DIAGNOSIS — O365930 Maternal care for other known or suspected poor fetal growth, third trimester, not applicable or unspecified: Secondary | ICD-10-CM

## 2024-07-13 DIAGNOSIS — O365920 Maternal care for other known or suspected poor fetal growth, second trimester, not applicable or unspecified: Secondary | ICD-10-CM

## 2024-07-13 NOTE — Progress Notes (Signed)
 Subjective:  Mia Mccoy is a 28 y.o. G3P1011 at [redacted]w[redacted]d being seen today for ongoing prenatal care.  She is currently monitored for the following issues for this high-risk pregnancy and has Supervision of high risk pregnancy, antepartum; Alpha thalassemia silent carrier; History of cesarean section; and IUGR (intrauterine growth restriction) affecting care of mother (resolved at 28w) on their problem list.  Patient reports no complaints.   .  .   . Denies leaking of fluid.   The following portions of the patient's history were reviewed and updated as appropriate: allergies, current medications, past family history, past medical history, past social history, past surgical history and problem list. Problem list updated.  Objective:  There were no vitals filed for this visit.  Fetal Status:           General:  Alert, oriented and cooperative. Patient is in no acute distress.  Skin: Skin is warm and dry. No rash noted.   Cardiovascular: Normal heart rate noted  Respiratory: Normal respiratory effort, no problems with respiration noted  Abdomen: Soft, gravid, appropriate for gestational age.       Pelvic:  Cervical exam deferred        Extremities: Normal range of motion.     Mental Status: Normal mood and affect. Normal behavior. Normal judgment and thought content.   Urinalysis:      Assessment and Plan:  Pregnancy: G3P1011 at [redacted]w[redacted]d  1. Supervision of high risk pregnancy, antepartum (Primary)  2. History of cesarean section - desires TOLAC  3. Poor fetal growth affecting management of mother in second trimester, single or unspecified fetus  4. Alpha thalassemia silent carrier  5. Obesity affecting pregnancy, antepartum, unspecified obesity type    Preterm labor symptoms and general obstetric precautions including but not limited to vaginal bleeding, contractions, leaking of fluid and fetal movement were reviewed in detail with the patient. Please refer to After Visit Summary  for other counseling recommendations.   No follow-ups on file.   Rudy Carlin LABOR, MD 07/13/2024

## 2024-07-13 NOTE — Progress Notes (Signed)
 Pt presents for ROB visit. Pt c/o pelvic pain and pressure. Declines Tdap and flu vaccines today.

## 2024-07-14 ENCOUNTER — Other Ambulatory Visit: Payer: Self-pay

## 2024-07-14 DIAGNOSIS — Z3A29 29 weeks gestation of pregnancy: Secondary | ICD-10-CM

## 2024-07-14 DIAGNOSIS — O0990 Supervision of high risk pregnancy, unspecified, unspecified trimester: Secondary | ICD-10-CM

## 2024-07-15 LAB — CBC
Hematocrit: 35.4 % (ref 34.0–46.6)
Hemoglobin: 11.2 g/dL (ref 11.1–15.9)
MCH: 26.7 pg (ref 26.6–33.0)
MCHC: 31.6 g/dL (ref 31.5–35.7)
MCV: 84 fL (ref 79–97)
Platelets: 240 x10E3/uL (ref 150–450)
RBC: 4.2 x10E6/uL (ref 3.77–5.28)
RDW: 13.1 % (ref 11.7–15.4)
WBC: 9.4 x10E3/uL (ref 3.4–10.8)

## 2024-07-15 LAB — RPR: RPR Ser Ql: NONREACTIVE

## 2024-07-15 LAB — GLUCOSE TOLERANCE, 2 HOURS W/ 1HR
Glucose, 1 hour: 147 mg/dL (ref 70–179)
Glucose, 2 hour: 126 mg/dL (ref 70–152)
Glucose, Fasting: 77 mg/dL (ref 70–91)

## 2024-07-15 LAB — HIV ANTIBODY (ROUTINE TESTING W REFLEX): HIV Screen 4th Generation wRfx: NONREACTIVE

## 2024-07-18 ENCOUNTER — Ambulatory Visit: Payer: Self-pay | Admitting: Physician Assistant

## 2024-07-20 ENCOUNTER — Encounter: Payer: Self-pay | Admitting: Obstetrics and Gynecology

## 2024-07-30 ENCOUNTER — Encounter: Payer: Self-pay | Admitting: Physician Assistant

## 2024-07-30 ENCOUNTER — Inpatient Hospital Stay (HOSPITAL_COMMUNITY)
Admission: AD | Admit: 2024-07-30 | Discharge: 2024-07-30 | Disposition: A | Discharge status: Home / Self Care | Payer: Self-pay | Attending: Obstetrics & Gynecology | Admitting: Obstetrics & Gynecology

## 2024-07-30 ENCOUNTER — Other Ambulatory Visit: Payer: Self-pay

## 2024-07-30 DIAGNOSIS — Z3689 Encounter for other specified antenatal screening: Secondary | ICD-10-CM

## 2024-07-30 DIAGNOSIS — O26893 Other specified pregnancy related conditions, third trimester: Secondary | ICD-10-CM | POA: Diagnosis not present

## 2024-07-30 DIAGNOSIS — N930 Postcoital and contact bleeding: Secondary | ICD-10-CM

## 2024-07-30 DIAGNOSIS — Z3A32 32 weeks gestation of pregnancy: Secondary | ICD-10-CM

## 2024-07-30 DIAGNOSIS — O98813 Other maternal infectious and parasitic diseases complicating pregnancy, third trimester: Secondary | ICD-10-CM | POA: Insufficient documentation

## 2024-07-30 DIAGNOSIS — B379 Candidiasis, unspecified: Secondary | ICD-10-CM

## 2024-07-30 LAB — URINALYSIS, ROUTINE W REFLEX MICROSCOPIC
Bilirubin Urine: NEGATIVE
Glucose, UA: 50 mg/dL — AB
Ketones, ur: NEGATIVE mg/dL
Nitrite: NEGATIVE
Protein, ur: 100 mg/dL — AB
Specific Gravity, Urine: 1.026 (ref 1.005–1.030)
Squamous Epithelial / HPF: >50 /HPF (ref 0–5)
pH: 6 (ref 5.0–8.0)

## 2024-07-30 LAB — WET PREP, GENITAL
Sperm: NONE SEEN
Trich, Wet Prep: NONE SEEN
WBC, Wet Prep HPF POC: >=10 — AB (ref <10)

## 2024-07-30 MED ORDER — TERCONAZOLE 0.8 % VA CREA
1.0000 | TOPICAL_CREAM | Freq: Every day | VAGINAL | 0 refills | Status: AC
Start: 2024-07-30 — End: ?

## 2024-07-30 NOTE — MAU Provider Note (Signed)
 Chief Complaint:  Vaginal Bleeding   HPI   Mia Mccoy is a 28 y.o. G3P1011 at [redacted]w[redacted]d who presents to maternity admissions reporting that she noticed some red vaginal bleeding that occurred at 10:00 today and continued to about 1031 with a small amount of abdominal cramping.  Patient states she noticed it when wiping after using the bathroom.  She also reports recent sex which was last night.  She denies any leaking of fluid, active heavy vaginal bleeding, contractions, and reports good fetal movements.   Pregnancy Course: [facility]  Her pregnancy course is complicated by a history of a cesarean section desiring [place], history of IUGR resulted 28 weeks, alpha thalassemia silent carrier trait,  obesity   Past Medical History:  Diagnosis Date   Anemia    Marijuana use 07/07/2017   Thyroid  disorder    OB History  Gravida Para Term Preterm AB Living  3 1 1  0 1 1  SAB IAB Ectopic Multiple Live Births  1 0 0 0 1    # Outcome Date GA Lbr Len/2nd Weight Sex Type Anes PTL Lv  3 Current           2 SAB 05/2023          1 Term 02/07/19 [redacted]w[redacted]d  3079 g F CS-LTranv EPI  LIV   Past Surgical History:  Procedure Laterality Date   CESAREAN SECTION Bilateral 02/07/2019   Procedure: CESAREAN SECTION;  Surgeon: Fredirick Glenys RAMAN, MD;  Location: [facility] LD [facility]S;  Service: Obstetrics;  Laterality: Bilateral;   WISDOM TOOTH EXTRACTION     Family History  Problem Relation Age of Onset   Cancer Mother    Cancer Father    Asthma Neg Hx    Diabetes Neg Hx    Heart disease Neg Hx    Hypertension Neg Hx    Social History   Tobacco Use   Smoking status: Never   Smokeless tobacco: Never   Tobacco comments:    Marijuana  Vaping Use   Vaping status: Never Used  Substance Use Topics   Alcohol use: No   Drug use: Not Currently    Types: Marijuana    Comment: Last use Sept 2019   Allergies  Allergen Reactions   Banana Itching   Medications Prior to Admission  Medication Sig Dispense Refill Last  Dose/Taking   Prenatal Vit-Fe Fumarate-FA (MULTIVITAMIN-PRENATAL) 27-0.8 MG TABS tablet Take 1 tablet by mouth daily at 12 noon.   07/30/2024   Blood Pressure Monitoring (BLOOD PRESSURE KIT) DEVI 1 Device by Does not apply route once a week. (Patient not taking: Reported on 07/13/2024) 1 each 0    metroNIDAZOLE  (METROGEL ) 0.75 % vaginal gel Place 1 Applicatorful vaginally at bedtime. Apply one applicatorful to vagina at bedtime for 5 days (Patient not taking: Reported on 07/13/2024) 70 g 0     I have reviewed patient's Past Medical Hx, Surgical Hx, Family Hx, Social Hx, medications and allergies.   ROS  Pertinent items noted in HPI and remainder of comprehensive ROS otherwise negative.   PHYSICAL EXAM  Patient Vitals for the past 24 hrs:  BP Temp Temp src Pulse Resp SpO2 Height Weight  07/30/24 1315 119/60 -- -- 91 -- 100 % -- --  07/30/24 1245 -- -- -- -- -- 98 % -- --  07/30/24 1237 109/62 -- -- 97 16 99 % -- --  07/30/24 1236 -- 98.3 F (36.8 C) Oral -- -- -- -- --  07/30/24 1217 -- -- -- -- -- --  4' 11 (1.499 m) 88.6 kg    Constitutional: Well-developed, obese  female in no acute distress.  Cardiovascular: normal rate & rhythm, warm and well-perfused Respiratory: normal effort, no problems with respiration noted GI: Abd soft, non-tender, gravid MS: Extremities nontender, no edema, normal ROM Neurologic: Alert and oriented x 4.  Pelvic: Exam chaperoned by Griselda Punter RN SSE: No pooling, curdy yellow/whitish discharge on B/L vaginal walls and at the cervix with minimal amount of dark blood in vault, cervix visually closed.  Vaginal swabs obtained SVE: Deferred ( No CTX on Toco and patient not c/o CTX)     Fetal Tracing: NST reactive  Baseline: 130-135 Variability: moderate  Accelerations: present Decelerations:absent Toco: no ctx     MDM & MAU COURSE  MDM:   HIGH  Prenatal chart reviewed Physical exam performed with pelvic/chaperoned by Versa Delay,  RN NST for gestational age and fetal reassurance-reactive NST Vaginal swabs obtained  Likely Yeast vaginitis with Postcoital bleeding.  -Will plan to treat with Terazol 3  at bedtime x 3 and abstain from sex during treatment - PCB due to recent sexual activity    MAU Course: Orders Placed This Encounter  Procedures   Wet prep, genital   Urinalysis, Routine w reflex microscopic -Urine, Clean Catch   Discharge patient Discharge disposition: 01-Home or Self Care; Discharge patient date: 07/30/2024     I have reviewed the patient chart and performed the physical exam . I have ordered & interpreted the lab results and reviewed and interpreted the NST  Medications ordered as stated below.  A/P as described below.  Counseling and education provided and patient agreeable  with plan as described below. Verbalized understanding.    ASSESSMENT   1. Yeast infection   2. [redacted] weeks gestation of pregnancy   3. NST (non-stress test) reactive on fetal surveillance   4. Postcoital bleeding      PLAN  Discharge home in stable condition with return precautions.   See AVS for full description of information given to the patient including both verbal and written. Patient verbalized understanding and agrees with the plan as described above.     Follow-up Information     [facility] for [facility] Follow up.   Specialty: Obstetrics and Gynecology Why: If symptoms worsen or fail to resolve, As scheduled for ongoing prenatal care Contact information: [address] [place]  [address] [phone]                Allergies as of 07/30/2024       Reactions   Banana Itching        Medication List     TAKE these medications    Blood Pressure Kit Devi 1 Device by Does not apply route once a week.   metroNIDAZOLE  0.75 % vaginal gel Commonly known as: METROGEL  Place 1 Applicatorful vaginally at bedtime. Apply one  applicatorful to vagina at bedtime for 5 days   multivitamin-prenatal 27-0.8 MG Tabs tablet Take 1 tablet by mouth daily at 12 noon.   terconazole  0.8 % vaginal cream Commonly known as: TERAZOL 3  Place 1 applicator vaginally at bedtime. Apply nightly for three nights.        Olam Dalton, MSN, [facility], Center for [facility]    This chart was dictated using voice recognition software, Dragon. Despite the best efforts of this provider to proofread and correct errors, errors may still occur which can change documentation meaning.

## 2024-07-30 NOTE — MAU Note (Signed)
 Mia Mccoy is a 28 y.o. at [redacted]w[redacted]d here in MAU reporting: small amount of bright red vaginal bleeding that occurred at 1005 and 1031 today. She reports having a small amount of abdominal cramping since this started. Patient denies LOF. Patient reports feeling fetal movement. Patient also reports having yellowish/curdy vaginal discharge. Last intercourse was yesterday.  EDC: 09/24/2024 Onset of complaint: today at 1005 Pain score: 0 Vitals:   07/30/24 1236 07/30/24 1237  BP:  109/62  Pulse:  97  Resp:  16  Temp: 98.3 F (36.8 C)   SpO2:  99%     FHT: 130 Lab orders placed from triage: urinalysis

## 2024-07-31 LAB — CULTURE, OB URINE

## 2024-08-01 LAB — GC/CHLAMYDIA PROBE AMP (~~LOC~~) NOT AT ARMC
Chlamydia: NEGATIVE
Comment: NEGATIVE
Comment: NORMAL
Neisseria Gonorrhea: NEGATIVE

## 2024-08-02 ENCOUNTER — Other Ambulatory Visit: Payer: Self-pay | Admitting: Maternal & Fetal Medicine

## 2024-08-02 ENCOUNTER — Ambulatory Visit: Payer: Self-pay | Attending: Obstetrics and Gynecology

## 2024-08-02 ENCOUNTER — Ambulatory Visit (HOSPITAL_BASED_OUTPATIENT_CLINIC_OR_DEPARTMENT_OTHER): Payer: Self-pay | Admitting: Maternal & Fetal Medicine

## 2024-08-02 VITALS — BP 110/57

## 2024-08-02 DIAGNOSIS — O99013 Anemia complicating pregnancy, third trimester: Secondary | ICD-10-CM

## 2024-08-02 DIAGNOSIS — O99213 Obesity complicating pregnancy, third trimester: Secondary | ICD-10-CM

## 2024-08-02 DIAGNOSIS — O365930 Maternal care for other known or suspected poor fetal growth, third trimester, not applicable or unspecified: Secondary | ICD-10-CM

## 2024-08-02 DIAGNOSIS — O34219 Maternal care for unspecified type scar from previous cesarean delivery: Secondary | ICD-10-CM

## 2024-08-02 DIAGNOSIS — O365910 Maternal care for other known or suspected poor fetal growth, first trimester, not applicable or unspecified: Secondary | ICD-10-CM

## 2024-08-02 DIAGNOSIS — O0990 Supervision of high risk pregnancy, unspecified, unspecified trimester: Secondary | ICD-10-CM

## 2024-08-02 DIAGNOSIS — Z3A32 32 weeks gestation of pregnancy: Secondary | ICD-10-CM | POA: Diagnosis not present

## 2024-08-02 DIAGNOSIS — D563 Thalassemia minor: Secondary | ICD-10-CM | POA: Diagnosis not present

## 2024-08-02 DIAGNOSIS — E669 Obesity, unspecified: Secondary | ICD-10-CM

## 2024-08-02 NOTE — Progress Notes (Signed)
 Patient information  Patient Name: Mia Mccoy  Patient MRN:   [id number]  Referring practice: MFM Referring Provider: [place] [facility]  Problem List   Patient Active Problem List   Diagnosis Date Noted   IUGR (intrauterine growth restriction) affecting care of mother (resolved at 28w) 06/29/2024   History of cesarean section 05/05/2024   Alpha thalassemia silent carrier 03/25/2024   Supervision of high risk pregnancy, antepartum 02/11/2024    Maternal Fetal medicine Consult  Mia Mccoy is a 28 y.o. G3P1011 at [redacted]w[redacted]d here for ultrasound and consultation. Marimar Huegel is doing well today with no acute concerns. Today we focused on the following:   Poor fetal growth: Fetal growth restriction I discussed the finding of fetal growth restriction (FGR) with the patient today. The ultrasound shows an overall growth at the 9th percentile and the abdominal circumference at the  percentile. The umbilical artery Dopplers are at the 99th percentile. BPP was 8/8. I counseled her about the clinical significance of the Doppler findings and antenatal testing. I discussed the various causes of growth restriction including constitutionally small fetus, placental insufficiency, genetic problems and chronic maternal disease. Currently there is no evidence of sonographic stigmata suggesting infection or aneuploidy.  I discussed the most likely cause of her fetal growth restriction is either a constitutionally small fetus or placental insufficiency or a combination of both. I also discussed the importance of antenatal fetal surveillance including antenatal testing and umbilical artery Doppler assessment to reduce the risk of stillbirth.  I discussed the management going forward in the pregnancy with potential alteration in the timing of delivery. Currently she feels well and denies headache, vision changes, right upper quadrant pain, contractions, vaginal bleeding or loss of fluid.  She reports  good fetal movement.   The patient had time to ask questions that were answered to her satisfaction.  She verbalized understanding and agrees to proceed with the plan below.  Recommendations - Continue weekly UA Dopplers and BPPs.  If umbilical artery Dopplers become elevated with periods of absent end-diastolic flow she should have a nonstress test added to allow for twice-weekly antenatal testing. - Continue serial growth ultrasounds every 3 weeks until delivery. - Delivery timing will depend on future antenatal testing and UA dopplers, but likely around 37 week  I spent 30 minutes reviewing the patients chart, including labs and images as well as counseling the patient about her medical conditions. Greater than 50% of the time was spent in direct face-to-face patient counseling.   Delora Smaller  MFM,    08/02/2024  12:57 PM   Review of Systems: A review of systems was performed and was negative except per HPI   Vitals and Physical Exam    08/02/2024   11:37 AM 07/30/2024    1:15 PM 07/30/2024   12:37 PM  Vitals with BMI  Systolic 110 119 890  Diastolic 57 60 62  Pulse  91 97    Sitting comfortably on the sonogram table Nonlabored breathing Normal rate and rhythm Abdomen is nontender  Past pregnancies OB History  Gravida Para Term Preterm AB Living  3 1 1  0 1 1  SAB IAB Ectopic Multiple Live Births  1 0 0 0 1    # Outcome Date GA Lbr Len/2nd Weight Sex Type Anes PTL Lv  3 Current           2 SAB 05/2023          1 Term 02/07/19 [redacted]w[redacted]d  6  lb 12.6 oz (3.079 kg) F CS-LTranv EPI  LIV     Future Appointments  Date Time Provider Department Center  08/09/2024  2:30 PM Trudy Leeroy NOVAK, MD [facility] None  08/17/2024  2:20 PM Thapa, [place], MD [facility] None

## 2024-08-10 ENCOUNTER — Ambulatory Visit (HOSPITAL_BASED_OUTPATIENT_CLINIC_OR_DEPARTMENT_OTHER): Payer: Self-pay | Admitting: Maternal & Fetal Medicine

## 2024-08-10 ENCOUNTER — Ambulatory Visit: Payer: Self-pay | Attending: Maternal & Fetal Medicine

## 2024-08-10 ENCOUNTER — Ambulatory Visit: Payer: Self-pay

## 2024-08-10 VITALS — BP 143/63 | HR 74

## 2024-08-10 DIAGNOSIS — Z98891 History of uterine scar from previous surgery: Secondary | ICD-10-CM | POA: Insufficient documentation

## 2024-08-10 DIAGNOSIS — O0990 Supervision of high risk pregnancy, unspecified, unspecified trimester: Secondary | ICD-10-CM

## 2024-08-10 DIAGNOSIS — O99013 Anemia complicating pregnancy, third trimester: Secondary | ICD-10-CM | POA: Diagnosis not present

## 2024-08-10 DIAGNOSIS — Z3A33 33 weeks gestation of pregnancy: Secondary | ICD-10-CM

## 2024-08-10 DIAGNOSIS — D649 Anemia, unspecified: Secondary | ICD-10-CM

## 2024-08-10 DIAGNOSIS — O365910 Maternal care for other known or suspected poor fetal growth, first trimester, not applicable or unspecified: Secondary | ICD-10-CM | POA: Insufficient documentation

## 2024-08-10 DIAGNOSIS — O99213 Obesity complicating pregnancy, third trimester: Secondary | ICD-10-CM

## 2024-08-10 DIAGNOSIS — O365930 Maternal care for other known or suspected poor fetal growth, third trimester, not applicable or unspecified: Secondary | ICD-10-CM

## 2024-08-10 DIAGNOSIS — E669 Obesity, unspecified: Secondary | ICD-10-CM | POA: Diagnosis not present

## 2024-08-10 NOTE — Progress Notes (Signed)
   Patient information  Patient Name: Mia Mccoy  Patient MRN:   [id number]  Referring practice: MFM Referring Provider: [place] [facility]  Problem List   Patient Active Problem List   Diagnosis Date Noted   IUGR (intrauterine growth restriction) affecting care of mother (resolved at 28w) 06/29/2024   History of cesarean section 05/05/2024   Alpha thalassemia silent carrier 03/25/2024   Supervision of high risk pregnancy, antepartum 02/11/2024    Maternal Fetal medicine Consult  Mia Mccoy is a 28 y.o. G3P1011 at [redacted]w[redacted]d here for ultrasound and consultation. Mia Mccoy is doing well today with no acute concerns. Today we focused on the following:   The patient is here for an ultrasound due to fetal growth restriction.  I discussed the umbilical artery Dopplers are slightly elevated today but there are no signs of absent or reversed flow.  We discussed the clinical significance of this including but not limited to an increased risk of placental insufficiency and stillbirth.  Also discussed the need for early delivery likely around 37 weeks.  She will continue to have weekly umbilical artery Dopplers and BPP with delivery at 37 weeks unless the clinical picture changes.  Growth ultrasound be done in 2 weeks.  Previously she was at the 9th percentile overall with a normal AC.  The patient had time to ask questions that were answered to her satisfaction.  She verbalized understanding and agrees to proceed with the plan below.  Standard OB precautions were given to the patient when appropriate based on the gestational age (fetal kick counts, importance of attending prenatal visits, any antenatal testing or ultrasounds that are scheduled as well as monitoring for signs and symptoms that is labor, vaginal bleeding, loss of amniotic fluid, or decreased fetal movement).  I also discussed the importance of monitoring for signs and symptoms of preeclampsia of which she has none  today.  Recommendations - Continue weekly umbilical artery Dopplers, BPP and serial growth ultrasounds as scheduled - Due to elevated umbilical artery Dopplers today delivery at 37 weeks should be considered if this persists during the pregnancy.  Greater than 50% of the time was spent in direct face-to-face patient counseling.  Delora Smaller  MFM, [place]   08/10/2024  10:16 AM   Review of Systems: A review of systems was performed and was negative except per HPI   Vitals and Physical Exam    08/10/2024   10:12 AM 08/10/2024    9:37 AM 08/02/2024   11:37 AM  Vitals with BMI  Systolic 106 143 889  Diastolic 63 63 57  Pulse 84 74     Sitting comfortably on the sonogram table Nonlabored breathing Normal rate and rhythm Abdomen is nontender  Past pregnancies OB History  Gravida Para Term Preterm AB Living  3 1 1  0 1 1  SAB IAB Ectopic Multiple Live Births  1 0 0 0 1    # Outcome Date GA Lbr Len/2nd Weight Sex Type Anes PTL Lv  3 Current           2 SAB 05/2023          1 Term 02/07/19 [redacted]w[redacted]d  6 lb 12.6 oz (3.079 kg) F CS-LTranv EPI  LIV     Future Appointments  Date Time Provider Department Center  08/17/2024  2:20 PM Thapa, Sudan, MD [facility] None

## 2024-08-17 ENCOUNTER — Ambulatory Visit: Payer: Self-pay | Attending: Maternal & Fetal Medicine | Admitting: Obstetrics and Gynecology

## 2024-08-17 ENCOUNTER — Ambulatory Visit: Admitting: Endocrinology

## 2024-08-17 ENCOUNTER — Other Ambulatory Visit: Payer: Self-pay

## 2024-08-17 ENCOUNTER — Other Ambulatory Visit: Payer: Self-pay | Admitting: *Deleted

## 2024-08-17 VITALS — BP 116/71 | HR 117

## 2024-08-17 DIAGNOSIS — Z98891 History of uterine scar from previous surgery: Secondary | ICD-10-CM | POA: Diagnosis not present

## 2024-08-17 DIAGNOSIS — O365930 Maternal care for other known or suspected poor fetal growth, third trimester, not applicable or unspecified: Secondary | ICD-10-CM

## 2024-08-17 DIAGNOSIS — O34219 Maternal care for unspecified type scar from previous cesarean delivery: Secondary | ICD-10-CM | POA: Diagnosis not present

## 2024-08-17 DIAGNOSIS — O0990 Supervision of high risk pregnancy, unspecified, unspecified trimester: Secondary | ICD-10-CM

## 2024-08-17 DIAGNOSIS — E669 Obesity, unspecified: Secondary | ICD-10-CM | POA: Diagnosis not present

## 2024-08-17 DIAGNOSIS — O365910 Maternal care for other known or suspected poor fetal growth, first trimester, not applicable or unspecified: Secondary | ICD-10-CM

## 2024-08-17 DIAGNOSIS — Z148 Genetic carrier of other disease: Secondary | ICD-10-CM | POA: Insufficient documentation

## 2024-08-17 DIAGNOSIS — Z3A34 34 weeks gestation of pregnancy: Secondary | ICD-10-CM | POA: Insufficient documentation

## 2024-08-17 DIAGNOSIS — O99213 Obesity complicating pregnancy, third trimester: Secondary | ICD-10-CM

## 2024-08-17 NOTE — Progress Notes (Signed)
 Maternal-Fetal Medicine Consultation  Name: Giovanna Kemmerer  MRN: [id number]  GA: [id number] [redacted]w[redacted]d   Fetal growth restriction.  On ultrasound performed 2 weeks ago, the estimated fetal weight was at the 9th percentile.  Patient does not have hypertension or gestational diabetes.  Blood pressure today in our office is 116/71 mmHg. Ultrasound Normal amniotic fluid.  Cephalic presentation.  Antenatal testing is reassuring.  Umbilical artery Doppler showed normal forward diastolic flow.  BPP 8/8.  Placenta is anterior and there is no evidence of previa or placenta accreta spectrum.  Today we discussed timing of delivery.  If severe fetal growth restriction persists, we will recommend delivery at [redacted] weeks gestation.I reassured the patient of normally located placenta.  Repeat cesarean deliveries increase the risks of placenta previa and/or placenta accreta spectrum.  Long-term complications include small bowel obstruction. Based on [facility] calculator, the likelihood of successful vaginal delivery is 36%. This is only an approximate calculation.  Patient is keen on VBAC.  Recommendations Continue weekly antenatal testing till delivery.     Consultation including face-to-face (more than 50%) counseling 20 minutes.

## 2024-08-26 ENCOUNTER — Ambulatory Visit: Payer: Self-pay | Attending: Obstetrics and Gynecology

## 2024-08-26 ENCOUNTER — Ambulatory Visit: Payer: Self-pay

## 2024-08-26 DIAGNOSIS — O99013 Anemia complicating pregnancy, third trimester: Secondary | ICD-10-CM

## 2024-08-26 DIAGNOSIS — E669 Obesity, unspecified: Secondary | ICD-10-CM | POA: Diagnosis not present

## 2024-08-26 DIAGNOSIS — Z3A35 35 weeks gestation of pregnancy: Secondary | ICD-10-CM

## 2024-08-26 DIAGNOSIS — O365930 Maternal care for other known or suspected poor fetal growth, third trimester, not applicable or unspecified: Secondary | ICD-10-CM | POA: Insufficient documentation

## 2024-08-26 DIAGNOSIS — O34219 Maternal care for unspecified type scar from previous cesarean delivery: Secondary | ICD-10-CM | POA: Diagnosis not present

## 2024-08-26 DIAGNOSIS — D563 Thalassemia minor: Secondary | ICD-10-CM | POA: Diagnosis not present

## 2024-08-26 DIAGNOSIS — O0990 Supervision of high risk pregnancy, unspecified, unspecified trimester: Secondary | ICD-10-CM | POA: Insufficient documentation

## 2024-08-26 DIAGNOSIS — O99213 Obesity complicating pregnancy, third trimester: Secondary | ICD-10-CM

## 2024-08-26 NOTE — Progress Notes (Signed)
 Maternal-Fetal Medicine Consultation  Name: Mia Mccoy  MRN: [id number]  GA: [id number] [redacted]w[redacted]d   Fetal growth restriction.  On previous fetal growth assessment, the estimated fetal weight was at the 9th percentile and the abdominal circumference measurement at the 23rd percentile. She does not have hypertension.  Blood pressure today at office is 116/71 mmHg. Obstetrical history significant for term cesarean delivery.  Patient is keen on VBAC.  Ultrasound The estimated fetal weight is at the 7th percentile and the abdominal circumference measurement at the 38th percentile.  Interval weight gain is 577 g over 3 weeks.  Head circumference measurement is at between -2 and -1 SD (normal).  Intracranial structures appear normal. Normal amniotic fluid.  Cephalic presentation.  Umbilical artery Doppler showed increased S/D ratio.  BPP 8/8.  I counseled the patient on the finding of fetal growth restriction (not severe) and interval weight gain.  I discussed the significance of abnormal Doppler studies that is associated with increased risk of perinatal mortality.  I recommend delivery at [redacted] weeks gestation.  Patient is very keen on trial of labor and she understands a small risk of uterine scar dehiscence (2% to 3% with induction of labor). She has not had a prenatal visit appointment for more than a month.  I strongly encouraged her to reach out to your office to make a prenatal visit appointment.  I sent an in-basket message to the providers.  Recommendations -Delivery at 37 weeks.     Consultation including face-to-face (more than 50%) counseling 20 minutes.

## 2024-08-30 ENCOUNTER — Encounter (HOSPITAL_COMMUNITY): Payer: Self-pay | Admitting: *Deleted

## 2024-08-30 ENCOUNTER — Encounter: Admitting: Obstetrics

## 2024-08-30 ENCOUNTER — Telehealth (HOSPITAL_COMMUNITY): Payer: Self-pay | Admitting: *Deleted

## 2024-08-30 NOTE — Progress Notes (Deleted)
 poct

## 2024-08-30 NOTE — Telephone Encounter (Signed)
 Preadmission screen

## 2024-09-05 ENCOUNTER — Other Ambulatory Visit: Payer: Self-pay | Admitting: Family Medicine

## 2024-09-05 DIAGNOSIS — O365920 Maternal care for other known or suspected poor fetal growth, second trimester, not applicable or unspecified: Secondary | ICD-10-CM

## 2024-09-06 ENCOUNTER — Encounter (HOSPITAL_COMMUNITY): Payer: Self-pay | Admitting: Family Medicine

## 2024-09-06 ENCOUNTER — Other Ambulatory Visit: Payer: Self-pay

## 2024-09-06 ENCOUNTER — Inpatient Hospital Stay (HOSPITAL_COMMUNITY)

## 2024-09-06 ENCOUNTER — Inpatient Hospital Stay (HOSPITAL_COMMUNITY)
Admission: AD | Admit: 2024-09-06 | Discharge: 2024-09-09 | DRG: 788 | Disposition: A | Discharge status: Home / Self Care | Attending: Obstetrics and Gynecology | Admitting: Obstetrics and Gynecology

## 2024-09-06 ENCOUNTER — Encounter (HOSPITAL_COMMUNITY)
Admission: AD | Disposition: A | Discharge status: Home / Self Care | Payer: Self-pay | Source: Home / Self Care | Attending: Obstetrics and Gynecology

## 2024-09-06 ENCOUNTER — Inpatient Hospital Stay (HOSPITAL_COMMUNITY): Admitting: Anesthesiology

## 2024-09-06 DIAGNOSIS — O0933 Supervision of pregnancy with insufficient antenatal care, third trimester: Secondary | ICD-10-CM

## 2024-09-06 DIAGNOSIS — Z148 Genetic carrier of other disease: Secondary | ICD-10-CM | POA: Diagnosis not present

## 2024-09-06 DIAGNOSIS — O34211 Maternal care for low transverse scar from previous cesarean delivery: Secondary | ICD-10-CM | POA: Diagnosis present

## 2024-09-06 DIAGNOSIS — Z3490 Encounter for supervision of normal pregnancy, unspecified, unspecified trimester: Principal | ICD-10-CM | POA: Diagnosis present

## 2024-09-06 DIAGNOSIS — Z3A37 37 weeks gestation of pregnancy: Secondary | ICD-10-CM

## 2024-09-06 DIAGNOSIS — Z98891 History of uterine scar from previous surgery: Secondary | ICD-10-CM

## 2024-09-06 DIAGNOSIS — O365930 Maternal care for other known or suspected poor fetal growth, third trimester, not applicable or unspecified: Principal | ICD-10-CM | POA: Diagnosis present

## 2024-09-06 DIAGNOSIS — O365920 Maternal care for other known or suspected poor fetal growth, second trimester, not applicable or unspecified: Secondary | ICD-10-CM

## 2024-09-06 LAB — CBC
HCT: 36.1 % (ref 36.0–46.0)
Hemoglobin: 11.9 g/dL — ABNORMAL LOW (ref 12.0–15.0)
MCH: 25.6 pg — ABNORMAL LOW (ref 26.0–34.0)
MCHC: 33 g/dL (ref 30.0–36.0)
MCV: 77.6 fL — ABNORMAL LOW (ref 80.0–100.0)
Platelets: 280 K/uL (ref 150–400)
RBC: 4.65 MIL/uL (ref 3.87–5.11)
RDW: 13.9 % (ref 11.5–15.5)
WBC: 10.2 K/uL (ref 4.0–10.5)
nRBC: 0 % (ref 0.0–0.2)

## 2024-09-06 LAB — GROUP B STREP BY PCR: Group B strep by PCR: NEGATIVE

## 2024-09-06 LAB — TYPE AND SCREEN
ABO/RH(D): O POS
Antibody Screen: NEGATIVE

## 2024-09-06 LAB — SYPHILIS: RPR W/REFLEX TO RPR TITER AND TREPONEMAL ANTIBODIES, TRADITIONAL SCREENING AND DIAGNOSIS ALGORITHM: RPR Ser Ql: NONREACTIVE

## 2024-09-06 SURGERY — Surgical Case
Anesthesia: Epidural

## 2024-09-06 MED ORDER — ONDANSETRON HCL 4 MG/2ML IJ SOLN
INTRAMUSCULAR | Status: DC | PRN
  Administered 2024-09-06: 4 mg via INTRAVENOUS

## 2024-09-06 MED ORDER — CEFAZOLIN SODIUM-DEXTROSE 2-3 GM-%(50ML) IV SOLR
INTRAVENOUS | Status: DC | PRN
  Administered 2024-09-06: 2 g via INTRAVENOUS

## 2024-09-06 MED ORDER — OXYTOCIN-SODIUM CHLORIDE 30-0.9 UT/500ML-% IV SOLN
2.5000 [IU]/h | INTRAVENOUS | Status: AC
  Administered 2024-09-07: 2.5 [IU]/h via INTRAVENOUS

## 2024-09-06 MED ORDER — SODIUM BICARBONATE 8.4 % IV SOLN: INTRAVENOUS | Status: DC | PRN

## 2024-09-06 MED ORDER — FENTANYL CITRATE (PF) 100 MCG/2ML IJ SOLN
INTRAMUSCULAR | Status: AC
  Filled 2024-09-06: qty 2

## 2024-09-06 MED ORDER — PHENYLEPHRINE 80 MCG/ML (10ML) SYRINGE FOR IV PUSH (FOR BLOOD PRESSURE SUPPORT): 80.0000 ug | PREFILLED_SYRINGE | INTRAVENOUS | Status: DC | PRN

## 2024-09-06 MED ORDER — OXYTOCIN-SODIUM CHLORIDE 30-0.9 UT/500ML-% IV SOLN
1.0000 m[IU]/min | INTRAVENOUS | Status: DC
  Administered 2024-09-06: 2 m[IU]/min via INTRAVENOUS
  Filled 2024-09-06: qty 500

## 2024-09-06 MED ORDER — IBUPROFEN 600 MG PO TABS
600.0000 mg | ORAL_TABLET | Freq: Four times a day (QID) | ORAL | Status: DC
  Administered 2024-09-07 – 2024-09-09 (×11): 600 mg via ORAL
  Filled 2024-09-06 (×11): qty 1

## 2024-09-06 MED ORDER — LACTATED RINGERS IV SOLN
500.0000 mL | Freq: Once | INTRAVENOUS | Status: AC
  Administered 2024-09-06: 500 mL via INTRAVENOUS

## 2024-09-06 MED ORDER — MORPHINE SULFATE (PF) 0.5 MG/ML IJ SOLN
INTRAMUSCULAR | Status: AC
  Filled 2024-09-06: qty 10

## 2024-09-06 MED ORDER — OXYCODONE HCL 5 MG PO TABS
5.0000 mg | ORAL_TABLET | ORAL | Status: DC | PRN
  Administered 2024-09-07: 10 mg via ORAL
  Administered 2024-09-07: 5 mg via ORAL
  Administered 2024-09-08 (×2): 10 mg via ORAL
  Filled 2024-09-06 (×3): qty 2
  Filled 2024-09-06: qty 1

## 2024-09-06 MED ORDER — ACETAMINOPHEN 325 MG PO TABS: 650.0000 mg | ORAL_TABLET | ORAL | Status: DC | PRN

## 2024-09-06 MED ORDER — STERILE WATER FOR IRRIGATION IR SOLN
Status: DC | PRN
  Administered 2024-09-06: 1000 mL

## 2024-09-06 MED ORDER — SIMETHICONE 80 MG PO CHEW
80.0000 mg | CHEWABLE_TABLET | Freq: Three times a day (TID) | ORAL | Status: DC
  Administered 2024-09-07 – 2024-09-09 (×7): 80 mg via ORAL
  Filled 2024-09-06 (×8): qty 1

## 2024-09-06 MED ORDER — POLYETHYLENE GLYCOL 3350 17 G PO PACK
17.0000 g | PACK | ORAL | Status: DC
  Administered 2024-09-07 – 2024-09-09 (×2): 17 g via ORAL
  Filled 2024-09-06 (×2): qty 1

## 2024-09-06 MED ORDER — SODIUM CHLORIDE 0.9 % IV SOLN
INTRAVENOUS | Status: DC | PRN
  Administered 2024-09-06: 500 mg via INTRAVENOUS

## 2024-09-06 MED ORDER — PRENATAL MULTIVITAMIN CH
1.0000 | ORAL_TABLET | Freq: Every day | ORAL | Status: DC
  Administered 2024-09-07 – 2024-09-09 (×3): 1 via ORAL
  Filled 2024-09-06 (×3): qty 1

## 2024-09-06 MED ORDER — LACTATED RINGERS IV SOLN
INTRAVENOUS | Status: DC
  Administered 2024-09-06: 125 mL/h via INTRAVENOUS

## 2024-09-06 MED ORDER — SIMETHICONE 80 MG PO CHEW: 80.0000 mg | CHEWABLE_TABLET | ORAL | Status: DC | PRN

## 2024-09-06 MED ORDER — FENTANYL CITRATE (PF) 100 MCG/2ML IJ SOLN
100.0000 ug | INTRAMUSCULAR | Status: DC | PRN
  Administered 2024-09-06 (×2): 100 ug via INTRAVENOUS
  Filled 2024-09-06 (×2): qty 2

## 2024-09-06 MED ORDER — CEFAZOLIN SODIUM-DEXTROSE 2-4 GM/100ML-% IV SOLN: 2.0000 g | INTRAVENOUS | Status: DC

## 2024-09-06 MED ORDER — DEXMEDETOMIDINE HCL IN NACL 80 MCG/20ML IV SOLN
INTRAVENOUS | Status: DC | PRN
  Administered 2024-09-06: 8 ug via INTRAVENOUS

## 2024-09-06 MED ORDER — MORPHINE SULFATE (PF) 0.5 MG/ML IJ SOLN
INTRAMUSCULAR | Status: DC | PRN
  Administered 2024-09-06: 3 mg via EPIDURAL

## 2024-09-06 MED ORDER — OXYTOCIN BOLUS FROM INFUSION: 333.0000 mL | Freq: Once | INTRAVENOUS | Status: DC

## 2024-09-06 MED ORDER — DEXAMETHASONE SOD PHOSPHATE PF 10 MG/ML IJ SOLN
INTRAMUSCULAR | Status: DC | PRN
  Administered 2024-09-06: 10 mg via INTRAVENOUS

## 2024-09-06 MED ORDER — MENTHOL 3 MG MT LOZG: 1.0000 | LOZENGE | OROMUCOSAL | Status: DC | PRN

## 2024-09-06 MED ORDER — OXYCODONE-ACETAMINOPHEN 5-325 MG PO TABS: 2.0000 | ORAL_TABLET | ORAL | Status: DC | PRN

## 2024-09-06 MED ORDER — SOD CITRATE-CITRIC ACID 500-334 MG/5ML PO SOLN
30.0000 mL | ORAL | Status: DC | PRN
  Administered 2024-09-06: 30 mL via ORAL
  Filled 2024-09-06: qty 30

## 2024-09-06 MED ORDER — OXYTOCIN-SODIUM CHLORIDE 30-0.9 UT/500ML-% IV SOLN: 2.5000 [IU]/h | INTRAVENOUS | Status: DC

## 2024-09-06 MED ORDER — ONDANSETRON HCL 4 MG/2ML IJ SOLN: 4.0000 mg | Freq: Four times a day (QID) | INTRAMUSCULAR | Status: DC | PRN

## 2024-09-06 MED ORDER — LIDOCAINE HCL (PF) 1 % IJ SOLN
INTRAMUSCULAR | Status: DC | PRN
  Administered 2024-09-06: 11 mL via EPIDURAL

## 2024-09-06 MED ORDER — TRANEXAMIC ACID-NACL 1000-0.7 MG/100ML-% IV SOLN
INTRAVENOUS | Status: DC | PRN
  Administered 2024-09-06: 1000 mg via INTRAVENOUS

## 2024-09-06 MED ORDER — DIPHENHYDRAMINE HCL 25 MG PO CAPS
25.0000 mg | ORAL_CAPSULE | Freq: Four times a day (QID) | ORAL | Status: DC | PRN
  Administered 2024-09-07: 25 mg via ORAL
  Filled 2024-09-06: qty 1

## 2024-09-06 MED ORDER — DIPHENHYDRAMINE HCL 50 MG/ML IJ SOLN: 12.5000 mg | INTRAMUSCULAR | Status: DC | PRN

## 2024-09-06 MED ORDER — LACTATED RINGERS AMNIOINFUSION
INTRAVENOUS | Status: DC
  Administered 2024-09-06: 125 mL/h via INTRAUTERINE

## 2024-09-06 MED ORDER — COCONUT OIL OIL: 1.0000 | TOPICAL_OIL | Status: DC | PRN

## 2024-09-06 MED ORDER — FENTANYL-BUPIVACAINE-NACL 0.5-0.125-0.9 MG/250ML-% EP SOLN
12.0000 mL/h | EPIDURAL | Status: DC | PRN
  Administered 2024-09-06: 12 mL/h via EPIDURAL
  Filled 2024-09-06: qty 250

## 2024-09-06 MED ORDER — ACETAMINOPHEN 10 MG/ML IV SOLN
INTRAVENOUS | Status: DC | PRN
  Administered 2024-09-06: 1000 mg via INTRAVENOUS

## 2024-09-06 MED ORDER — GABAPENTIN 300 MG PO CAPS
300.0000 mg | ORAL_CAPSULE | Freq: Two times a day (BID) | ORAL | Status: DC
  Administered 2024-09-07 – 2024-09-09 (×6): 300 mg via ORAL
  Filled 2024-09-06 (×6): qty 1

## 2024-09-06 MED ORDER — WITCH HAZEL-GLYCERIN EX PADS: 1.0000 | MEDICATED_PAD | CUTANEOUS | Status: DC | PRN

## 2024-09-06 MED ORDER — DIPHENHYDRAMINE HCL 50 MG/ML IJ SOLN
25.0000 mg | Freq: Once | INTRAMUSCULAR | Status: AC
  Administered 2024-09-06: 25 mg via INTRAVENOUS
  Filled 2024-09-06: qty 1

## 2024-09-06 MED ORDER — TRANEXAMIC ACID-NACL 1000-0.7 MG/100ML-% IV SOLN: 1000.0000 mg | Freq: Once | INTRAVENOUS | Status: DC

## 2024-09-06 MED ORDER — LACTATED RINGERS IV SOLN
500.0000 mL | INTRAVENOUS | Status: DC | PRN
  Administered 2024-09-06: 500 mL via INTRAVENOUS

## 2024-09-06 MED ORDER — DIBUCAINE (PERIANAL) 1 % EX OINT: 1.0000 | TOPICAL_OINTMENT | CUTANEOUS | Status: DC | PRN

## 2024-09-06 MED ORDER — FLUCONAZOLE 150 MG PO TABS
150.0000 mg | ORAL_TABLET | Freq: Once | ORAL | Status: AC
  Administered 2024-09-06: 150 mg via ORAL
  Filled 2024-09-06: qty 1

## 2024-09-06 MED ORDER — EPHEDRINE SULFATE-NACL 50-0.9 MG/10ML-% IV SOSY
PREFILLED_SYRINGE | INTRAVENOUS | Status: DC | PRN
  Administered 2024-09-06 (×2): 2.5 mg via INTRAVENOUS

## 2024-09-06 MED ORDER — EPHEDRINE 5 MG/ML INJ: 10.0000 mg | INTRAVENOUS | Status: DC | PRN

## 2024-09-06 MED ORDER — LIDOCAINE-EPINEPHRINE (PF) 2 %-1:200000 IJ SOLN
INTRAMUSCULAR | Status: DC | PRN
  Administered 2024-09-06: 3 mL via EPIDURAL
  Administered 2024-09-06: 5 mL via EPIDURAL
  Administered 2024-09-06 (×2): 2 mL via EPIDURAL

## 2024-09-06 MED ORDER — TERBUTALINE SULFATE 1 MG/ML IJ SOLN
0.2500 mg | Freq: Once | INTRAMUSCULAR | Status: AC | PRN
  Administered 2024-09-06: 0.25 mg via SUBCUTANEOUS

## 2024-09-06 MED ORDER — PENICILLIN G POT IN DEXTROSE 60000 UNIT/ML IV SOLN: 3.0000 10*6.[IU] | INTRAVENOUS | Status: DC

## 2024-09-06 MED ORDER — SODIUM CHLORIDE 0.9 % IV SOLN: 500.0000 mg | INTRAVENOUS | Status: DC

## 2024-09-06 MED ORDER — OXYTOCIN-SODIUM CHLORIDE 30-0.9 UT/500ML-% IV SOLN
INTRAVENOUS | Status: DC | PRN
  Administered 2024-09-06: 200 mL via INTRAVENOUS

## 2024-09-06 MED ORDER — ENOXAPARIN SODIUM 40 MG/0.4ML IJ SOSY
40.0000 mg | PREFILLED_SYRINGE | INTRAMUSCULAR | Status: DC
  Administered 2024-09-07 – 2024-09-08 (×2): 40 mg via SUBCUTANEOUS
  Filled 2024-09-06 (×2): qty 0.4

## 2024-09-06 MED ORDER — SODIUM CHLORIDE 0.9 % IV SOLN: 5.0000 10*6.[IU] | Freq: Once | INTRAVENOUS | Status: DC

## 2024-09-06 MED ORDER — SODIUM CHLORIDE 0.9 % IR SOLN
Status: DC | PRN
  Administered 2024-09-06: 1

## 2024-09-06 MED ORDER — SOD CITRATE-CITRIC ACID 500-334 MG/5ML PO SOLN: 30.0000 mL | ORAL | Status: DC

## 2024-09-06 MED ORDER — LIDOCAINE HCL (PF) 1 % IJ SOLN: 30.0000 mL | INTRAMUSCULAR | Status: DC | PRN

## 2024-09-06 MED ORDER — PHENYLEPHRINE 80 MCG/ML (10ML) SYRINGE FOR IV PUSH (FOR BLOOD PRESSURE SUPPORT)
80.0000 ug | PREFILLED_SYRINGE | INTRAVENOUS | Status: AC | PRN
  Administered 2024-09-06 (×8): 160 ug via INTRAVENOUS

## 2024-09-06 MED ORDER — FLUCONAZOLE 100MG IVPB: 150.0000 mg | Freq: Once | INTRAVENOUS | Status: DC

## 2024-09-06 MED ORDER — FENTANYL CITRATE (PF) 100 MCG/2ML IJ SOLN
INTRAMUSCULAR | Status: DC | PRN
  Administered 2024-09-06: 100 ug via EPIDURAL

## 2024-09-06 MED ORDER — OXYCODONE-ACETAMINOPHEN 5-325 MG PO TABS: 1.0000 | ORAL_TABLET | ORAL | Status: DC | PRN

## 2024-09-06 MED ORDER — PHENYLEPHRINE 80 MCG/ML (10ML) SYRINGE FOR IV PUSH (FOR BLOOD PRESSURE SUPPORT)
PREFILLED_SYRINGE | INTRAVENOUS | Status: AC
  Filled 2024-09-06: qty 20

## 2024-09-06 MED ORDER — SENNOSIDES-DOCUSATE SODIUM 8.6-50 MG PO TABS: 2.0000 | ORAL_TABLET | Freq: Every evening | ORAL | Status: DC | PRN

## 2024-09-06 SURGICAL SUPPLY — 30 items
BENZOIN TINCTURE PRP APPL 2/3 (GAUZE/BANDAGES/DRESSINGS) ×1 IMPLANT
CANISTER SUCT 3000ML PPV (MISCELLANEOUS) ×1 IMPLANT
CHLORAPREP W/TINT 26 (MISCELLANEOUS) ×2 IMPLANT
CLAMP UMBILICAL CORD (MISCELLANEOUS) ×1 IMPLANT
DERMABOND ADVANCED .7 DNX12 (GAUZE/BANDAGES/DRESSINGS) IMPLANT
DRSG OPSITE POSTOP 4X10 (GAUZE/BANDAGES/DRESSINGS) ×1 IMPLANT
ELECTRODE REM PT RTRN 9FT ADLT (ELECTROSURGICAL) ×1 IMPLANT
EXTRACTOR VACUUM KIWI (MISCELLANEOUS) ×1 IMPLANT
GAUZE PAD ABD 7.5X8 STRL (GAUZE/BANDAGES/DRESSINGS) IMPLANT
GAUZE SPONGE 4X4 12PLY STRL LF (GAUZE/BANDAGES/DRESSINGS) IMPLANT
GLOVE BIOGEL PI IND STRL 7.0 (GLOVE) ×2 IMPLANT
GLOVE BIOGEL PI IND STRL 7.5 (GLOVE) ×1 IMPLANT
GLOVE SURG SS PI 7.0 STRL IVOR (GLOVE) ×1 IMPLANT
GOWN STRL REUS W/ TWL LRG LVL3 (GOWN DISPOSABLE) ×2 IMPLANT
GOWN STRL REUS W/ TWL XL LVL3 (GOWN DISPOSABLE) ×1 IMPLANT
MAT PREVALON FULL STRYKER (MISCELLANEOUS) IMPLANT
NS IRRIG 1000ML POUR BTL (IV SOLUTION) ×1 IMPLANT
PACK C SECTION WH (CUSTOM PROCEDURE TRAY) ×1 IMPLANT
PAD ABD 7.5X8 STRL (GAUZE/BANDAGES/DRESSINGS) ×1 IMPLANT
PAD OB MATERNITY 4.3X12.25 (PERSONAL CARE ITEMS) ×1 IMPLANT
PAD PREP 24X48 CUFFED NSTRL (MISCELLANEOUS) ×1 IMPLANT
RETRACTOR WND ALEXIS 25 LRG (MISCELLANEOUS) ×1 IMPLANT
STRIP CLOSURE SKIN 1/2X4 (GAUZE/BANDAGES/DRESSINGS) ×1 IMPLANT
SUT MNCRL 0 VIOLET CTX 36 (SUTURE) ×2 IMPLANT
SUT MON AB 4-0 PS1 27 (SUTURE) ×1 IMPLANT
SUT VIC AB 0 CT1 36 (SUTURE) ×1 IMPLANT
SUT VICRYL+ 3-0 36IN CT-1 (SUTURE) ×1 IMPLANT
SUTURE PLAIN GUT 2.0 ETHICON (SUTURE) ×1 IMPLANT
TOWEL OR 17X24 6PK STRL BLUE (TOWEL DISPOSABLE) ×2 IMPLANT
WATER STERILE IRR 1000ML POUR (IV SOLUTION) ×1 IMPLANT

## 2024-09-06 NOTE — Progress Notes (Signed)
 Labor Progress Note Mia Mccoy is a 28 y.o. G3P1011 at [redacted]w[redacted]d presented for IOL for FGR (7%) with abnormal dopplers. History of c/section and desires TOLAC .  S:  Comfortable with epidural  O:  BP 133/73   Pulse 82   Temp 97.7 F (36.5 C) (Oral)   Resp 18   Ht 4' 11 (1.499 m)   Wt 88 kg   LMP 12/19/2023 Comment: Irregular periods  SpO2 100%   BMI 39.18 kg/m   EFM: baseline 125 bpm/ moderate variability/ 15x15 accels/ variable decels  Toco/IUPC: q 2-3 mins  SVE: Dilation: 6 Effacement (%): 80 Station: -1 Presentation: Vertex Exam by:: [name] Student CNM Pitocin : 6 mu/min  A/P: 28 y.o. G3P1011 [redacted]w[redacted]d  1. Labor: Notified by RN that foleybulb came out. Discussed RBA with patient about AROM. Patient agreeable. Clear fluid noted. 2. FWB: Cat 2. Moderate variability with occasional variables. Overall reassuring. 3. Pain: epidural 4. Hx C/S: Desires TOLAC   Anticipate VBAC.  Mia Mccoy VEAR Me, Student-MidWife 6:35 PM

## 2024-09-06 NOTE — Progress Notes (Signed)
 Labor Progress Note Mia Mccoy is a 28 y.o. G3P1011 at [redacted]w[redacted]d presented for by Last Menstrual Period presenting for IOL for FGR (7%) with abnormal dopplers. History of c/section and desires TOLAC She received her prenatal care at [facility]   S:  Patient up and walking in room.  O:  BP 114/71   Pulse 86   Temp 98 F (36.7 C) (Oral)   Resp 16   Ht 4' 11 (1.499 m)   Wt 88 kg   LMP 12/19/2023 Comment: Irregular periods  SpO2 99%   BMI 39.18 kg/m   EFM: baseline 130 bpm/ moderate variability/ 15x15 accels/ no decels  Toco/IUPC:  q 2-4 mins  SVE: Dilation: Fingertip Effacement (%): Thick Station: Ballotable Presentation: Vertex (confirmed by bedside US  by Antonetta Perks RN) Exam by:: midwife student [name] Pitocin : 6 mu/min  A/P: 28 y.o. G3P1011 [redacted]w[redacted]d  1. Labor: Foley balloon still in place. Continue to check FB each hour. 2. FWB: Cat 1 3. Pain: plans epidural 4. Hx C/S: Desires TOLAC.    Anticipate SVB.  [name] Me, Student-MidWife 2:37 PM

## 2024-09-06 NOTE — Progress Notes (Signed)
 L&D Note  09/06/2024 - 9:17 PM  28 y.o. G3P1011 [redacted]w[redacted]d. Pregnancy complicated by:  Patient Active Problem List   Diagnosis Date Noted   Encounter for induction of labor 09/06/2024   Insufficient prenatal care in third trimester 09/06/2024   IUGR (intrauterine growth restriction) affecting care of mother (resolved at 28w) 06/29/2024   History of cesarean section 05/05/2024   Alpha thalassemia silent carrier 03/25/2024   Supervision of high risk pregnancy, antepartum 02/11/2024    [name] is admitted for IOL for FGR   Subjective:  Pt comfortable with epidural  Objective:   Vitals:   09/06/24 1930 09/06/24 2001 09/06/24 2031 09/06/24 2101  BP: (!) 140/66 110/89 (!) 97/52 92/73  Pulse: 98 (!) 128    Resp:      Temp:      TempSrc:      SpO2:      Weight:      Height:        Current Vital Signs 24h Vital Sign Ranges  T 97.8 F (36.6 C) Temp  Avg: 97.9 F (36.6 C)  Min: 97.7 F (36.5 C)  Max: 98 F (36.7 C)  BP 92/73 BP  Min: 92/73  Max: 140/66  HR (!) 128 Pulse  Avg: 88.9  Min: 66  Max: 128  RR 18 Resp  Avg: 16.5  Min: 16  Max: 18  SaO2 100 % Room Air SpO2  Avg: 99.5 %  Min: 99 %  Max: 100 %       24 Hour I/O Current Shift I/O  Time Ins Outs No intake/output data recorded. No intake/output data recorded.   FHR: 150 baseline, no accel, recurrent variables, minimal variabiilty Toco: q2-4m Gen: NAD SVE: cervix unchanged and still swollen at 5-6cm  Labs:  Recent Labs  Lab 09/06/24 0816  WBC 10.2  HGB 11.9*  HCT 36.1  PLT 280   No results for input(s): NA, K, CL, CO2, BUN, CREATININE, CALCIUM, PROT, BILITOT, ALKPHOS, ALT, AST, GLUCOSE in the last 168 hours.  Invalid input(s): LABALBU  Medications Current Facility-Administered Medications  Medication Dose Route Frequency Provider Last Rate Last Admin   acetaminophen  (TYLENOL ) tablet 650 mg  650 mg Oral Q4H PRN Stinson, Jacob J, DO       diphenhydrAMINE  (BENADRYL )  injection 12.5 mg  12.5 mg Intravenous Q15 min PRN Corinne Garnette BRAVO, MD       ePHEDrine  injection 10 mg  10 mg Intravenous PRN Corinne Garnette BRAVO, MD       ePHEDrine  injection 10 mg  10 mg Intravenous PRN Corinne Garnette BRAVO, MD       fentaNYL  (SUBLIMAZE ) injection 100 mcg  100 mcg Intravenous Q1H PRN Walker, Jamilla R, CNM   100 mcg at 09/06/24 1625   fentaNYL  2 mcg/mL w/ bupivacaine  0.125% in NS 250 mL epidural infusion  12 mL/hr Epidural Continuous PRN Corinne Garnette BRAVO, MD 12 mL/hr at 09/06/24 1724 12 mL/hr at 09/06/24 1724   lactated ringers  amnioinfusion   Intrauterine Continuous Leftwich-Kirby, Olam LABOR, CNM 125 mL/hr at 09/06/24 2008 New Bag at 09/06/24 2008   lactated ringers  infusion 500-1,000 mL  500-1,000 mL Intravenous PRN Stinson, Jacob J, DO 999 mL/hr at 09/06/24 1950 500 mL at 09/06/24 1950   lactated ringers  infusion   Intravenous Continuous Stinson, Jacob J, DO 125 mL/hr at 09/06/24 1825 New Bag at 09/06/24 1825   lidocaine  (PF) (XYLOCAINE ) 1 % injection 30 mL  30 mL Subcutaneous PRN Stinson, Jacob J, DO  ondansetron  (ZOFRAN ) injection 4 mg  4 mg Intravenous Q6H PRN Stinson, Jacob J, DO       oxyCODONE -acetaminophen  (PERCOCET/ROXICET) 5-325 MG per tablet 1 tablet  1 tablet Oral Q4H PRN Stinson, Jacob J, DO       oxyCODONE -acetaminophen  (PERCOCET/ROXICET) 5-325 MG per tablet 2 tablet  2 tablet Oral Q4H PRN Stinson, Jacob J, DO       oxytocin  (PITOCIN ) IV BOLUS FROM BAG  333 mL Intravenous Once Stinson, Jacob J, DO       Followed by   oxytocin  (PITOCIN ) IV infusion 30 units in NS 500 mL - Premix  2.5 Units/hr Intravenous Continuous Stinson, Jacob J, DO       oxytocin  (PITOCIN ) IV infusion 30 units in NS 500 mL - Premix  1-40 milli-units/min Intravenous Titrated Stinson, Jacob J, DO   Stopped at 09/06/24 2009   PHENYLephrine  80 mcg/ml in normal saline Adult IV Push Syringe (For Blood Pressure Support)  80-160 mcg Intravenous PRN Corinne Garnette BRAVO, MD       PHENYLephrine  80 mcg/ml in normal  saline Adult IV Push Syringe (For Blood Pressure Support)  80 mcg Intravenous PRN Corinne Garnette BRAVO, MD       sodium citrate -citric acid  (ORACIT) solution 30 mL  30 mL Oral Q2H PRN Stinson, Jacob J, DO       Facility-Administered Medications Ordered in Other Encounters  Medication Dose Route Frequency Provider Last Rate Last Admin   lidocaine  (PF) (XYLOCAINE ) 1 % injection   Epidural Anesthesia Intra-op Cleotilde Butler Dade, MD   11 mL at 09/06/24 1724    Assessment & Plan:  Pt stable *Labor: pitocin  already turned off. Will give a dose of terbutaline . FSE in place as well as IUPC with AI. D/w her that I recommend proceeding with urgent rpt c/s for failed IOL for fetal indications. R/b d/w her and family and they are amenable with proceeding *Analgesia: epidural working well  Bebe Furry, Mickey. MD Attending Center for [employer] [facility])

## 2024-09-06 NOTE — Anesthesia Preprocedure Evaluation (Addendum)
"                                    Anesthesia Evaluation  Patient identified by MRN, date of birth, ID band Patient awake    Reviewed: Allergy & Precautions, H&P , NPO status , Patient's Chart, lab work & pertinent test results  Airway Mallampati: II   Neck ROM: full    Dental  (+) Dental Advisory Given   Pulmonary neg pulmonary ROS   breath sounds clear to auscultation       Cardiovascular negative cardio ROS  Rhythm:regular Rate:Normal     Neuro/Psych    GI/Hepatic   Endo/Other    Renal/GU      Musculoskeletal   Abdominal   Peds  Hematology   Anesthesia Other Findings   Reproductive/Obstetrics (+) Pregnancy                              Anesthesia Physical Anesthesia Plan  ASA: II  Anesthesia Plan: Epidural   Post-op Pain Management: Ofirmev  IV (intra-op)* and Toradol  IV (intra-op)*   Induction: Intravenous  PONV Risk Score and Plan: 2 and Treatment may vary due to age or medical condition, Ondansetron  and Dexamethasone   Airway Management Planned: Natural Airway  Additional Equipment:   Intra-op Plan:   Post-operative Plan:   Informed Consent: I have reviewed the patients History and Physical, chart, labs and discussed the procedure including the risks, benefits and alternatives for the proposed anesthesia with the patient or authorized representative who has indicated his/her understanding and acceptance.     Dental advisory given  Plan Discussed with: Anesthesiologist  Anesthesia Plan Comments:          Anesthesia Quick Evaluation  "

## 2024-09-06 NOTE — Progress Notes (Signed)
 OB Note I spoke with Neo (Dr. Clotilda) and GBS negative PCR is sufficient for not needing antibiotics.   Bebe Izell Raddle MD Attending Center for [employer] (Faculty Practice) 09/06/2024 Time: [phone]

## 2024-09-06 NOTE — Op Note (Signed)
 Operative Note   SURGERY DATE: 09/06/2024  PRE-OP DIAGNOSIS:  *Pregnancy at 37/3 *IOL for FGR *Recurrent late decelerations *Failed TOLAC  POST-OP DIAGNOSIS: Same. Delivered   PROCEDURE: Repeat low transverse cesarean section via pfannenstiel skin incision with single layer uterine closure  SURGEON: Surgeons and Role:    * Mccoy Harari, MD - Primary  ASSISTANT:    DEWAINE Danny Geralds, DO - Fellow  An experienced assistant was required given the standard of surgical care given the complexity of the case.  This assistant was needed for exposure, dissection, suctioning, retraction, instrument exchange, assisting with delivery with administration of fundal pressure, and for overall help during the procedure.  ANESTHESIA: epidural  ESTIMATED BLOOD LOSS:  DRAINS: 25mL UOP via indwelling foley with pre and post op stable bloody UOP  TOTAL IV FLUIDS: 1500mL crystalloid  VTE PROPHYLAXIS: SCDs to bilateral lower extremities  ANTIBIOTICS: Two grams of Cefazolin  were given., within 1 hour of skin incision along with azithromycin  500mg  IV   SPECIMENS: none  COMPLICATIONS: none  FINDINGS: No intra-abdominal adhesions were noted. There were filmy adhesions of the fimbriae and tubes bilaterally. Grossly normal uterus, tubes and ovaries.  Clear amniotic fluid, cephalic, asynclitic, direct OP, female infant, weight 2370gm, APGARs 5/9, intact placenta. Arterial pH 7.11/CO2 74/Bicarb 23.5/A-B deficit 7.4 Venous pH 7.16/CO2 64/Bicarb 22.8/A-B deficit 6.9  PROCEDURE IN DETAIL: The patient was taken to the operating room where anesthesia was administered and normal fetal heart tones were confirmed. She was then prepped and draped in the normal fashion in the dorsal supine position with a leftward tilt.  After a time out was performed, a pfannensteil  skin incision was made with the scalpel and carried through to the underlying layer of fascia. The fascia was then incised at the midline and this  incision was extended laterally with the mayo scissors. Attention was turned to the superior aspect of the fascial incision which was grasped with the kocher clamps x 2, tented up and the rectus muscles were dissected off with the scalpel. In a similar fashion the inferior aspect of the fascial incision was grasped with the kocher clamps, tented up and the rectus muscles dissected off with the mayo scissors. The rectus muscles were then separated in the midline and the peritoneum was entered bluntly. The bladder blade was inserted and the vesicouterine peritoneum was identified, tented up and entered with the metzenbaum scissors. This incision was extended laterally and the bladder flap was created digitally. The bladder blade was reinserted.  A low transverse hysterotomy was made with the scalpel until the endometrial cavity was breached and the amniotic sac ruptured with the Allis clamp, yielding clear amniotic fluid. This incision was extended bluntly and the infants head, shoulders and body were delivered atraumatically.The cord was clamped x 2 and cut, and the infant was handed to the awaiting pediatricians, after delayed cord clamping was not done.  The placenta was then gradually expressed from the uterus and then the uterus was exteriorized and cleared of all clots and debris. The hysterotomy was repaired with a running suture of 1-0 monocryl. A  figure-of-eight suture of 1-0 monocryl was added to achieve excellent hemostasis.   The uterus and adnexa were then returned to the abdomen, and the hysterotomy and all operative sites were reinspected and excellent hemostasis was noted after irrigation and suction of the abdomen with warm saline.  The peritoneum was closed with a running stitch of 3-0 Vicryl. The fascia was reapproximated with 0 Vicryl in a  simple running fashion bilaterally. The subcutaneous layer was then reapproximated with interrupted sutures of 2-0 plain gut, and the skin was then  closed with 4-0 monocryl, in a subcuticular fashion.  The patient  tolerated the procedure well. Sponge, lap, needle, and instrument counts were correct x 2. The patient was transferred to the recovery room awake, alert and breathing independently in stable condition.  Mia Mccoy Overcast MD Attending Center for [facility])

## 2024-09-06 NOTE — Progress Notes (Signed)
 Labor Progress Note Mia Mccoy is a 28 y.o. G3P1011 at [redacted]w[redacted]d presented for  IOL for FGR (7%) with abnormal dopplers. History of c/section and desires TOLAC.   S:  Comfortable with epidural.  O:  BP 110/89   Pulse (!) 128   Temp 97.8 F (36.6 C)   Resp 18   Ht 4' 11 (1.499 m)   Wt 88 kg   LMP 12/19/2023 Comment: Irregular periods  SpO2 100%   BMI 39.18 kg/m   EFM: baseline 145 bpm/ moderate variability/ no accels/ variable decels  Toco/IUPC: q 1.5-2  SVE: Dilation: 6 Effacement (%): 80 Cervical Position: Anterior Station: -1 Presentation: Vertex Exam by:: Armas Mcbee, SCNM Pitocin : 0 mu/min  A/P: 28 y.o. G3P1011 [redacted]w[redacted]d  1. Labor: RBA of IUPC with amnioinfusion and FSE discussed. Patient agreeable. 2. FWB: Cat 2. Moderate variability in between recurrent variables. Pitocin  turned off and amnioinfusion started. Will reassess the need to restart pitocin  when Cat 1. 3. Pain: Epidural 4. Hx C/S: Desires TOLAC   Guarded VBAC.  Mia Winthrop H Travontae Freiberger, Student-MidWife 8:22 PM

## 2024-09-06 NOTE — Transfer of Care (Signed)
 Immediate Anesthesia Transfer of Care Note  Patient: Mia Mccoy  Procedure(s) Performed: CESAREAN DELIVERY  Patient Location: PACU  Anesthesia Type:Epidural  Level of Consciousness: awake, alert , and oriented  Airway & Oxygen Therapy: Patient Spontanous Breathing  Post-op Assessment: Report given to RN and Post -op Vital signs reviewed and stable  Post vital signs: Reviewed and stable  Last Vitals:  Vitals Value Taken Time  BP 123/56 09/06/24 23:19  Temp 36.9 C 09/06/24 23:19  Pulse 126 09/06/24 23:25  Resp 15 09/06/24 23:25  SpO2 99 % 09/06/24 23:25  Vitals shown include unfiled device data.  Last Pain:  Vitals:   09/06/24 2319  TempSrc: Oral  PainSc: 0-No pain         Complications: No notable events documented.

## 2024-09-06 NOTE — Discharge Instructions (Signed)
°Cesarean Delivery, Care After °Refer to this sheet in the next few weeks. These instructions provide you with information on caring for yourself after your procedure. Your health care provider may also give you specific instructions. Your treatment has been planned according to current medical practices, but problems sometimes occur. Call your health care provider if you have any problems or questions after you go home. °HOME CARE INSTRUCTIONS  °· Only take over-the-counter or prescription medications as directed by your health care provider. °· Do not drink alcohol, especially if you are breastfeeding or taking medication to relieve pain. °· Do not  smoke tobacco. °· Continue to use good perineal care. Good perineal care includes: °¨ Wiping your perineum from front to back. °¨ Keeping your perineum clean. °· Check your surgical cut (incision) daily for increased redness, drainage, swelling, or separation of skin. °· Shower and clean your incision gently with soap and water every day, by letting warm and soapy water run over the incision, and then pat it dry. If your health care provider says it is okay, leave the incision uncovered. Use a bandage (dressing) if the incision is draining fluid or appears irritated. If the adhesive strips across the incision do not fall off within 7 days, carefully peel them off, after a shower. °· Hug a pillow when coughing or sneezing until your incision is healed. This helps to relieve pain. °· Do not use tampons, douches or have sexual intercourse, until your health care provider says it is okay. °· Wear a well-fitting bra that provides breast support. °· Limit wearing support panties or control-top hose. °· Drink enough fluids to keep your urine clear or pale yellow. °· Eat high-fiber foods such as whole grain cereals and breads, brown rice, beans, and fresh fruits and vegetables every day. These foods may help prevent or relieve constipation. °· Resume activities such as  climbing stairs, driving, lifting, exercising, or traveling as directed by your health care provider. °· Try to have someone help you with your household activities and your newborn for at least a few days after you leave the hospital. °· Rest as much as possible. Try to rest or take a nap when your newborn is sleeping. °· Increase your activities gradually. °· Do not lift more than 15lbs until directed by a provider. °· Keep all of your scheduled postpartum appointments. It is very important to keep your scheduled follow-up appointments. At these appointments, your health care provider will be checking to make sure that you are healing physically and emotionally. °SEEK MEDICAL CARE IF:  °· You are passing large clots from your vagina. Save any clots to show your health care provider. °· You have a foul smelling discharge from your vagina. °· You have trouble urinating. °· You are urinating frequently. °· You have pain when you urinate. °· You have a change in your bowel movements. °· You have increasing redness, pain, or swelling near your incision. °· You have pus draining from your incision. °· Your incision is separating. °· You have painful, hard, or reddened breasts. °· You have a severe headache. °· You have blurred vision or see spots. °· You feel sad or depressed. °· You have thoughts of hurting yourself or your newborn. °· You have questions about your care, the care of your newborn, or medications. °· You are dizzy or light-headed. °· You have a rash. °· You have pain, redness, or swelling at the site of the removed intravenous access (IV) tube. °· You have nausea   or vomiting. °· You stopped breastfeeding and have not had a menstrual period within 12 weeks of stopping. °· You are not breastfeeding and have not had a menstrual period within 12 weeks of delivery. °· You have a fever. °SEEK IMMEDIATE MEDICAL CARE IF: °· You have persistent pain. °· You have chest pain. °· You have shortness of breath. °· You  faint. °· You have leg pain. °· You have stomach pain. °· Your vaginal bleeding saturates 2 or more sanitary pads in 1 hour. °MAKE SURE YOU:  °· Understand these instructions. °· Will watch your condition. °· Will get help right away if you are not doing well or get worse. °Document Released: 05/17/2002 Document Revised: 01/09/2014 Document Reviewed: 04/21/2012 °ExitCare® Patient Information ©2015 [employer]. This information is not intended to replace advice given to you by your health care provider. Make sure you discuss any questions you have with your health care provider. ° ° °

## 2024-09-06 NOTE — Anesthesia Procedure Notes (Signed)
 Epidural Patient location during procedure: OB Start time: 09/06/2024 5:14 PM End time: 09/06/2024 5:27 PM  Staffing Anesthesiologist: Cleotilde Butler Dade, MD Performed: anesthesiologist   Preanesthetic Checklist Completed: patient identified, IV checked, site marked, risks and benefits discussed, surgical consent, monitors and equipment checked, pre-op evaluation and timeout performed  Epidural Patient position: sitting Prep: ChloraPrep Patient monitoring: heart rate, cardiac monitor, continuous pulse ox and blood pressure Approach: midline Location: L2-L3 Injection technique: LOR saline  Needle:  Needle type: Tuohy  Needle gauge: 17 G Needle length: 9 cm Needle insertion depth: 7 cm Catheter type: closed end flexible Catheter size: 20 Guage Catheter at skin depth: 11 cm Test dose: negative  Assessment Events: blood not aspirated, injection not painful, no injection resistance, no paresthesia and negative IV test  Additional Notes Reason for block:procedure for pain

## 2024-09-06 NOTE — Discharge Summary (Signed)
 "    Postpartum Discharge Summary  Date of Service updated***     Patient Name: Mia Mccoy DOB: 11-18-1995 MRN: [id number]  Date of admission: 09/06/2024 Delivery date:09/06/2024 Delivering provider: IZELL HARARI Date of discharge: 09/06/2024  Admitting diagnosis: Pregnancy at 37/3. IOL for FGR. History of c-section.   Secondary diagnosis:  Principal Problem:   Encounter for induction of labor Active Problems:   Insufficient prenatal care in third trimester    Discharge diagnosis: {DX.:23714}                                              Post partum procedures:{Postpartum procedures:23558} Augmentation: AROM, Pitocin , and IP Foley Complications: recurrent late and variable declerations remote from delivery  Hospital course: Induction of Labor With Cesarean Section   28 y.o. yo [id number] at [redacted]w[redacted]d was admitted to the hospital 09/06/2024 for induction of labor. Patient had a labor course significant for above. The patient went for cesarean section due to rpt c/s. Delivery details are as follows: Membrane Rupture Time/Date: 6:28 PM,09/06/2024  Delivery Method:C-Section, Low Transverse Operative Delivery:N/A Details of operation can be found in separate operative Note.  Patient had a postpartum course complicated by***. She is ambulating, tolerating a regular diet, passing flatus, and urinating well.  Patient is discharged home in stable condition on 09/06/2024.      Newborn Data: Birth date:09/06/2024 Birth time:10:05 PM Gender:Female Living status:Living Apgars:5 ,9  Weight:2370 g                               Magnesium Sulfate received: {Mag received:30440022} BMZ received: No Rhophylac:N/A MMR:N/A T-DaP:{Tdap:23962} Flu: {Qol:76036} RSV Vaccine received: {RSV:31013} Transfusion:{Transfusion received:30440034} Immunizations administered: Immunization History  Administered Date(s) Administered   Tdap 07/07/2017, 11/26/2018    Physical exam  Vitals:    09/06/24 1930 09/06/24 2001 09/06/24 2031 09/06/24 2101  BP: (!) 140/66 110/89 (!) 97/52 92/73  Pulse: 98 (!) 128    Resp:      Temp:      TempSrc:      SpO2:      Weight:      Height:       General: {Exam; general:21111117} Lochia: {Desc; appropriate/inappropriate:30686::appropriate} Uterine Fundus: {Desc; firm/soft:30687} Incision: {Exam; incision:21111123} DVT Evaluation: {Exam; dvt:2111122} Labs: Lab Results  Component Value Date   WBC 10.2 09/06/2024   HGB 11.9 (L) 09/06/2024   HCT 36.1 09/06/2024   MCV 77.6 (L) 09/06/2024   PLT 280 09/06/2024      Latest Ref Rng & Units 02/07/2019   12:26 PM  CMP  Glucose 70 - 99 mg/dL 897   BUN 6 - 20 mg/dL 11   Creatinine 9.55 - 1.00 mg/dL 8.98   Sodium 864 - 854 mmol/L 137   Potassium 3.5 - 5.1 mmol/L 4.3   Chloride 98 - 111 mmol/L 108   CO2 22 - 32 mmol/L 22   Calcium 8.9 - 10.3 mg/dL 8.2   Total Protein 6.5 - 8.1 g/dL 4.2   Total Bilirubin 0.3 - 1.2 mg/dL 0.4   Alkaline Phos 38 - 126 U/L 123   AST 15 - 41 U/L 25   ALT 0 - 44 U/L 12    Edinburgh Score:    03/25/2019   11:10 AM  Edinburgh Postnatal Depression Scale Screening Tool  I have been able  to laugh and see the funny side of things. 0   I have looked forward with enjoyment to things. 0   I have blamed myself unnecessarily when things went wrong. 0   I have been anxious or worried for no good reason. 2   I have felt scared or panicky for no good reason. 0   Things have been getting on top of me. 0   I have been so unhappy that I have had difficulty sleeping. 0   I have felt sad or miserable. 0   I have been so unhappy that I have been crying. 0   The thought of harming myself has occurred to me. 0   Edinburgh Postnatal Depression Scale Total 2      Data saved with a previous flowsheet row definition      After visit meds:  Allergies as of 09/06/2024   No Known Allergies   Med Rec must be completed prior to using this  [facility]***        Discharge home in stable condition Infant Feeding: {Baby feeding:23562} Infant Disposition:{CHL IP OB HOME WITH FNUYZM:76418} Discharge instruction: per After Visit Summary and Postpartum booklet. Activity: Advance as tolerated. Pelvic rest for 6 weeks.  Diet: {OB diet:21111121} Anticipated Birth Control: {Birth Control:23956} Postpartum Appointment:{Outpatient follow up:23559} Additional Postpartum F/U: {PP Procedure:23957} Future Appointments:No future appointments. Follow up Visit:  [X]  message sent 12/30 for 1wk incision check      09/06/2024 Bebe Furry, MD   "

## 2024-09-06 NOTE — H&P (Addendum)
 OBSTETRIC ADMISSION HISTORY AND PHYSICAL  Mia Mccoy is 28 y.o. G3P1011 with IUP at [redacted]w[redacted]d 09/24/2024, by Last Menstrual Period presenting for IOL for FGR (7%) with abnormal dopplers. History of c/section and desires TOLAC She received her prenatal care at [facility]   ROS (+) FM, ctx q 8 mins (-) VB, LOF. HA, visual changes, CP, SOB, RUQ pain, peripheral edema.   Prenatal History/Complications NURSING  PROVIDER  Office Location [facility] Dating by LMP c/w US  at [redacted]w[redacted]d at [facility]  [facility] Model Traditional Anatomy U/S incomplete  Initiated care at  [employer]  English               LAB RESULTS   Support Person   Genetics NIPS: LR AFP: neg      NT/IT (FT only)        Carrier Screen Horizon: Alpha Thalassemia silent carrier  Rhogam    A1C/GTT Early HgbA1C: 5.6% (03/10/24) Third trimester 2 hr GTT:   Flu Vaccine  No      TDaP Vaccine  Declined 07-13-24 Blood Type  O positive (03/10/24)  RSV Vaccine   Antibody  Negative (03/10/24)  COVID Vaccine  No Rubella  10.60 (03/10/24)  Feeding Plan breast RPR Nonreactive (03/10/24)  Contraception Undecided HBsAg Negative (03/10/24)  Circumcision Girl HIV Nonreactive (03/10/24)  Pediatrician  [facility] HCVAb  Non Reactive (03/10/24)  Prenatal Classes        BTL Consent   Pap       Diagnosis  Date Value Ref Range Status  11/13/2023     Final    - Negative for intraepithelial lesion or malignancy (NILM)    BTL Pre-payment   GC/CT Initial:   36wks:    VBAC Consent   GBS   For PCN allergy, check sensitivities   BRx Optimized? [ ]  yes   [X]  no      DME Rx [X]  BP cuff [ ]  Weight Scale Waterbirth  [ ]  Class [ ]  Consent [ ]  CNM visit  PHQ9 & GAD7 [ x ] new OB [ x ] 28 weeks  [  ] 36 weeks Induction  [ ]  Orders Entered [ ] Foley Y/N   OB History  Gravida Para Term Preterm AB Living  3 1 1  0 1 1  SAB IAB Ectopic Multiple Live Births  1 0 0 0 1    # Outcome Date GA Lbr Len/2nd Weight Sex Type Anes PTL Lv  3  Current           2 SAB 05/2023          1 Term 02/07/19 [redacted]w[redacted]d  3079 g F CS-LTranv EPI  LIV   Patient Active Problem List   Diagnosis Date Noted   Encounter for induction of labor 09/06/2024   IUGR (intrauterine growth restriction) affecting care of mother (resolved at 28w) 06/29/2024   History of cesarean section 05/05/2024   Alpha thalassemia silent carrier 03/25/2024   Supervision of high risk pregnancy, antepartum 02/11/2024    Past Medical History: Past Medical History:  Diagnosis Date   Anemia    Marijuana use 07/07/2017   Thyroid  disorder     Past Surgical History: Past Surgical History:  Procedure Laterality Date   CESAREAN SECTION Bilateral 02/07/2019   Procedure: CESAREAN SECTION;  Surgeon: Fredirick Glenys RAMAN, MD;  Location: [facility] LD ORS;  Service: Obstetrics;  Laterality:  Bilateral;   WISDOM TOOTH EXTRACTION      Social History Social History   Socioeconomic History   Marital status: Single    Spouse name: Not on file   Number of children: 1   Years of education: High School   Highest education level: 12th grade  Occupational History   Not on file  Tobacco Use   Smoking status: Never   Smokeless tobacco: Never   Tobacco comments:    Marijuana  Vaping Use   Vaping status: Never Used  Substance and Sexual Activity   Alcohol use: No   Drug use: Not Currently    Types: Marijuana    Comment: Last use Sept 2019   Sexual activity: Yes    Birth control/protection: None  Other Topics Concern   Not on file  Social History Narrative   Not on file   Social Drivers of Health   Tobacco Use: Low Risk (09/06/2024)   Patient History    Smoking Tobacco Use: Never    Smokeless Tobacco Use: Never    Passive Exposure: Not on file  Financial Resource Strain: Not on file  Food Insecurity: No Food Insecurity (09/06/2024)   Epic    Worried About Programme Researcher, Broadcasting/film/video in the Last Year: Never true    Ran Out of Food in the Last Year: Never true  Transportation Needs: No  Transportation Needs (09/06/2024)   Epic    Lack of Transportation (Medical): No    Lack of Transportation (Non-Medical): No  Physical Activity: Not on file  Stress: Not on file  Social Connections: Unknown (09/06/2024)   Social Connection and Isolation Panel    Frequency of Communication with Friends and Family: Not on file    Frequency of Social Gatherings with Friends and Family: Not on file    Attends Religious Services: Not on file    Active Member of Clubs or Organizations: Not on file    Attends Banker Meetings: Not on file    Marital Status: Never married  Depression (PHQ2-9): Low Risk (07/13/2024)   Depression (PHQ2-9)    PHQ-2 Score: 0  Alcohol Screen: Not on file  Housing: Unknown (09/06/2024)   Epic    Unable to Pay for Housing in the Last Year: No    Number of Times Moved in the Last Year: Not on file    Homeless in the Last Year: No  Utilities: Not At Risk (09/06/2024)   Epic    Threatened with loss of utilities: No  Health Literacy: Not on file    Family History: Family History  Problem Relation Age of Onset   Cancer Mother    Cancer Father    Asthma Neg Hx    Diabetes Neg Hx    Heart disease Neg Hx    Hypertension Neg Hx     Allergies: Allergies[1]  Medications Prior to Admission  Medication Sig Dispense Refill Last Dose/Taking   Blood Pressure Monitoring (BLOOD PRESSURE KIT) DEVI 1 Device by Does not apply route once a week. (Patient not taking: Reported on 07/13/2024) 1 each 0    metroNIDAZOLE  (METROGEL ) 0.75 % vaginal gel Place 1 Applicatorful vaginally at bedtime. Apply one applicatorful to vagina at bedtime for 5 days (Patient not taking: Reported on 07/13/2024) 70 g 0    Prenatal Vit-Fe Fumarate-FA (MULTIVITAMIN-PRENATAL) 27-0.8 MG TABS tablet Take 1 tablet by mouth daily at 12 noon.      terconazole  (TERAZOL 3 ) 0.8 % vaginal cream Place 1 applicator vaginally  at bedtime. Apply nightly for three nights. (Patient not taking: Reported on  08/17/2024) 20 g 0      Review of Systems  All systems reviewed and negative except as stated in HPI  PHYSICAL EXAM Blood pressure 112/86, pulse (!) 125, temperature 98 F (36.7 C), temperature source Oral, resp. rate 16, height 4' 11 (1.499 m), weight 88 kg, last menstrual period 12/19/2023, SpO2 99%, unknown if currently breastfeeding. General appearance: alert and cooperative Lungs: respirations nonlabored Heart: regular rate105 Abdomen: gravid  Fetal monitoringBaseline: 130 bpm, Variability: Good {> 6 bpm), Accelerations: Reactive, and Decelerations: Absent Uterine activity q 8 mins  Dilation: Fingertip Effacement (%): Thick Station: Ballotable Exam by:: midwife student [name] Presentation: cephalic   Prenatal labs: ABO, Rh: --/--/O POS (12/30 9187) Antibody: NEG (12/30 9187) Rubella: 10.60 (07/03 1430) RPR: Non Reactive (11/06 0849)  HBsAg: Negative (07/03 1430)  HIV: Non Reactive (11/06 0849)   Lab Results  Component Value Date   GBS Positive (A) 01/13/2019    Sono at [redacted]w[redacted]d: normal anatomy, cephalic presentation, anterior placenta, EFW 2269g, (7%). AC (38%)  Immunization History  Administered Date(s) Administered   Tdap 07/07/2017, 11/26/2018    Prenatal Transfer Tool  Maternal Diabetes: No Genetic Screening: Normal Maternal Ultrasounds/Referrals: IUGR Fetal Ultrasounds or other Referrals:  None Maternal Substance Abuse:  No Significant Maternal Medications:  None Significant Maternal Lab Results: Group B Strep negative Number of Prenatal Visits:greater than 3 verified prenatal visits Maternal Vaccinations: none Other Comments:  None   Results for orders placed or performed during the hospital encounter of 09/06/24 (from the past 24 hours)  Type and screen   Collection Time: 09/06/24  8:12 AM  Result Value Ref Range   ABO/RH(D) O POS    Antibody Screen NEG    Sample Expiration      09/09/2024,2359 Performed at [facility],  [address] [place], [place] [address]   CBC   Collection Time: 09/06/24  8:16 AM  Result Value Ref Range   WBC 10.2 4.0 - 10.5 K/uL   RBC 4.65 3.87 - 5.11 MIL/uL   Hemoglobin 11.9 (L) 12.0 - 15.0 g/dL   HCT 63.8 63.9 - 53.9 %   MCV 77.6 (L) 80.0 - 100.0 fL   MCH 25.6 (L) 26.0 - 34.0 pg   MCHC 33.0 30.0 - 36.0 g/dL   RDW 86.0 88.4 - 84.4 %   Platelets 280 150 - 400 K/uL   nRBC 0.0 0.0 - 0.2 %    Patient Active Problem List   Diagnosis Date Noted   Encounter for induction of labor 09/06/2024   IUGR (intrauterine growth restriction) affecting care of mother (resolved at 28w) 06/29/2024   History of cesarean section 05/05/2024   Alpha thalassemia silent carrier 03/25/2024   Supervision of high risk pregnancy, antepartum 02/11/2024    ASSESSMENT & PLAN TONGELA ENCINAS is 28 y.o. G3P1011 with IUP at [redacted]w[redacted]d 09/24/2024, by Last Menstrual Period admitted for IOL for FGR (7%) with abnormal dopplers. History of c/section and desires TOLAC.  Sono at [redacted]w[redacted]d: normal anatomy, cephalic presentation, anterior placenta, EFW 2269g, (7%). AC (38%)  #Labor: Foley bulb placed and Pitocin  started to stop at 6 until foley bulb out. #Pain: Plans epidural #FWB: Cat 1 #Hx C/S: Desires TOLAC. Consent signed. #FGR #GBS status:  Negative in urine culture 07/30/24 #Feeding: Breastmilk  #Reproductive Life planning: None #Circ:  not applicable   [name], SNM, RNC-OB Student Nurse Midwife 09/06/2024 10:19 AM      [  1] No Known Allergies

## 2024-09-07 ENCOUNTER — Encounter (HOSPITAL_COMMUNITY): Payer: Self-pay | Admitting: Obstetrics and Gynecology

## 2024-09-07 LAB — CBC
HCT: 30.9 % — ABNORMAL LOW (ref 36.0–46.0)
Hemoglobin: 10.1 g/dL — ABNORMAL LOW (ref 12.0–15.0)
MCH: 26 pg (ref 26.0–34.0)
MCHC: 32.7 g/dL (ref 30.0–36.0)
MCV: 79.4 fL — ABNORMAL LOW (ref 80.0–100.0)
Platelets: 227 K/uL (ref 150–400)
RBC: 3.89 MIL/uL (ref 3.87–5.11)
RDW: 13.9 % (ref 11.5–15.5)
WBC: 21.7 K/uL — ABNORMAL HIGH (ref 4.0–10.5)
nRBC: 0 % (ref 0.0–0.2)

## 2024-09-07 LAB — CREATININE, SERUM
Creatinine, Ser: 0.69 mg/dL (ref 0.44–1.00)
GFR, Estimated: >60 mL/min

## 2024-09-07 MED ORDER — FERROUS SULFATE 325 (65 FE) MG PO TBEC
325.0000 mg | DELAYED_RELEASE_TABLET | Freq: Every day | ORAL | Status: DC
  Filled 2024-09-07: qty 1

## 2024-09-07 MED ORDER — FERROUS SULFATE 325 (65 FE) MG PO TABS
325.0000 mg | ORAL_TABLET | Freq: Every day | ORAL | Status: DC
  Administered 2024-09-07 – 2024-09-09 (×3): 325 mg via ORAL
  Filled 2024-09-07 (×3): qty 1

## 2024-09-07 NOTE — Lactation Note (Signed)
 This note was copied from a baby's chart. Lactation Consultation Note  Patient Name: Mia Mccoy Unijb'd Date: 09/07/2024 Age:29 hours Reason for consult: Follow-up assessment;Early term 37-38.6wks;Infant < 6lbs  P2. Mom tried to latch baby but she was to sleepy. RN called LC d/t glucose d/t to be drawn and wanted to see if baby could feed. Mom holding baby STS. Praised mom. LC stimulated baby to wake her. Sleepy. Gave formula. Baby does have leakage at times during feeding w/white nipple. Mom very sleepy. Placed baby in bassinet and sleeping. Lab in drawing glucose. Maternal Data Does the patient have breastfeeding experience prior to this delivery?: Yes How long did the patient breastfeed?: tried but wouldn't latch  Feeding Mother's Current Feeding Choice: Breast Milk and Formula Nipple Type: Nfant Standard Flow (white)  LATCH Score Latch: Repeated attempts needed to sustain latch, nipple held in mouth throughout feeding, stimulation needed to elicit sucking reflex.  Audible Swallowing: Spontaneous and intermittent  Type of Nipple: Flat  Comfort (Breast/Nipple): Soft / non-tender  Hold (Positioning): Full assist, staff holds infant at breast  LATCH Score: 7   Lactation Tools Discussed/Used Tools: Pump;Flanges Flange Size: 21 Breast pump type: Double-Electric Breast Pump Pump Education: Setup, frequency, and cleaning;Milk Storage Reason for Pumping: less than 6 lbs  Interventions Interventions: Breast feeding basics reviewed;Hand express;Breast compression;Adjust position;Support pillows;Position options;DEBP;Education;Pace feeding;LC Psychologist, educational;Infant Driven Feeding Algorithm education;LPT handout/interventions  Discharge WIC Program: Yes  Consult Status Consult Status: Follow-up Date: 09/07/24 Follow-up type: In-patient    Stormee Duda G 09/07/2024, 3:36 AM

## 2024-09-07 NOTE — Lactation Note (Signed)
 This note was copied from a baby's chart. Lactation Consultation Note  Patient Name: Mia Mccoy Date: 09/07/2024 Age:29 hours Reason for consult: Initial assessment;Early term 37-38.6wks;Infant < 6lbs  P2. Mom stated she tried to BF her 1st child now 5 but she wouldn't latch. Mom BF this baby in PACU well. Baby gave baby formula baby took 10ml and baby gagged on nipple and threw up everything she drank and mucous. White nipple was given and helpful. Baby did have some leakage. Mom feeding bay in upright position. Gave mom Plan of care feeding  card and reviewed w/mom . Mom stated understanding. LPI information sheet given and highlighted about feeding time. Discussed mom pumping. Mom shown how to use DEBP & how to disassemble, clean, & reassemble parts. Pump every 3 hrs. Mom encouraged to feed baby 8-12 times/24 hours and with feeding cues. Wake baby every 3 hrs if hasn't cued before then. Didn't get to work w/mom on BF at this time. Encouraged mom to call for assistance as needed. Discussed importance of baby feeding and conserving energy.  LC will go back later. Mom is hungry, snacking and wanting to text. LC will try to go back for next feeding.   Maternal Data Does the patient have breastfeeding experience prior to this delivery?: Yes How long did the patient breastfeed?: tried but wouldn't latch  Feeding Mother's Current Feeding Choice: Breast Milk and Formula Nipple Type: Nfant Standard Flow (white)  LATCH Score Latch: Repeated attempts needed to sustain latch, nipple held in mouth throughout feeding, stimulation needed to elicit sucking reflex.  Audible Swallowing: Spontaneous and intermittent  Type of Nipple: Flat  Comfort (Breast/Nipple): Soft / non-tender  Hold (Positioning): Full assist, staff holds infant at breast  LATCH Score: 7   Lactation Tools Discussed/Used Tools: Pump;Flanges Flange Size: 21 Breast pump type: Double-Electric Breast  Pump Pump Education: Setup, frequency, and cleaning;Milk Storage Reason for Pumping: less than 6 lbs  Interventions Interventions: Breast feeding basics reviewed;Hand express;Breast compression;Adjust position;Support pillows;Position options;DEBP;Education;Pace feeding;LC Psychologist, educational;Infant Driven Feeding Algorithm education;LPT handout/interventions  Discharge WIC Program: Yes  Consult Status Consult Status: Follow-up Date: 09/07/24 Follow-up type: In-patient    Marylon Verno G 09/07/2024, 1:13 AM

## 2024-09-07 NOTE — Anesthesia Postprocedure Evaluation (Signed)
"   Anesthesia Post Note  Patient: Mia Mccoy  Procedure(s) Performed: CESAREAN DELIVERY     Patient location during evaluation: PACU Anesthesia Type: Epidural Level of consciousness: awake and alert Pain management: pain level controlled Vital Signs Assessment: post-procedure vital signs reviewed and stable Respiratory status: spontaneous breathing Cardiovascular status: stable Postop Assessment: spinal receding Anesthetic complications: no   No notable events documented.  Last Vitals:  Vitals:   09/07/24 0230 09/07/24 0330  BP: (!) 116/56 124/67  Pulse: 90 80  Resp: 18 19  Temp: 36.8 C 36.9 C  SpO2: 99% 99%    Last Pain:  Vitals:   09/07/24 0330  TempSrc: Axillary  PainSc:                  Norleen Pope      "

## 2024-09-07 NOTE — Progress Notes (Signed)
 Subjective: Postpartum Day 1: Cesarean Delivery Patient reports tolerating PO and + flatus.  Reports pain is tolerable. She will be getting up to ambulate after breakfast. She still has catheter in place  Objective: Vital signs in last 24 hours: Temp:  [97.7 F (36.5 C)-98.9 F (37.2 C)] 98.4 F (36.9 C) (12/31 0330) Pulse Rate:  [62-128] 80 (12/31 0330) Resp:  [16-22] 19 (12/31 0330) BP: (92-140)/(52-89) 124/67 (12/31 0330) SpO2:  [96 %-100 %] 99 % (12/31 0330)  Physical Exam:  General: alert, cooperative, and no distress Lochia: appropriate Uterine Fundus: soft Incision: no significant drainage, pressure dressing in place  DVT Evaluation: No evidence of DVT seen on physical exam.  Recent Labs    09/06/24 0816 09/07/24 0449  HGB 11.9* 10.1*  HCT 36.1 30.9*    Assessment/Plan: Status post Cesarean section. Doing well postoperatively.  Continue current care Continue scheduled medication Discussed hydration and ambulation, discussed gum can assist with peristalsis . Continue lovenox   No method for contraception  Nidia Daring, FNP

## 2024-09-08 MED ORDER — ACETAMINOPHEN 325 MG PO TABS
650.0000 mg | ORAL_TABLET | Freq: Four times a day (QID) | ORAL | Status: DC | PRN
  Administered 2024-09-08 (×2): 650 mg via ORAL
  Filled 2024-09-08 (×2): qty 2

## 2024-09-08 MED ORDER — OXYCODONE HCL 5 MG PO TABS
5.0000 mg | ORAL_TABLET | ORAL | Status: DC | PRN
  Administered 2024-09-08 – 2024-09-09 (×4): 10 mg via ORAL
  Filled 2024-09-08 (×4): qty 2

## 2024-09-08 NOTE — Social Work (Signed)
 CSW received consult for Patient wants resources for supplies/needs PP. She said she has a carseat and crib, but doesn't have formula, diapers, or wipes and would like resources. CSW met with MOB to offer support and complete assessment.     CSW followed up with MOB at beside and introduced CSW role. CSW explained the reason for the visit and assessed for needs. CSW inquired if MOB received WIC and SNAP benefits. MOB reported that she receives SNAP benefits and had received a call from [facility] yesterday regarding signing up for [facility] benefits. MOB reported that she plans to contact the [facility] office tomorrow. CSW inquired if MOB could purchase her infant's formula with SNAP benefits until she could schedule her [facility] appointment. MOB stated that she could use her SNAP benefits to purchase formula for her infant. MOB stated that she could also purchase some diapers and wipes but wanted to know if the hospital could supply her with these items as well. CSW informed MOB that hospital could not provide a supply of these items; however, she could take any baby items that were provided. MOB verbalized understanding. CSW discussed community referrals for baby items. MOB gave CSW permission to make a referral to [facility] [employer] [facility] and declined the referral to [facility]. CSW inquired if MOB had chosen a pediatrician for her infant. MOB reported [facility] and confirmed transportation.  CSW offered MOB a pack of new parent's choice newborn diapers (45). MOB reported I'm not picky, and expressed appreciation for the diapers.   -CSW received an error on [employer] program website stating that MOB's address was already in use. CSW provided MOB with the contact information for [employer] program. To follow up. She agreed to follow up.   CSW assessed MOB for additional needs. MOB reported none.    Nat Quiet, MSW, LCSW Clinical Social Worker  [phone] 09/08/2024  1:50  PM

## 2024-09-08 NOTE — Patient Instructions (Signed)
 If you are interested in an outpatient lactation consultation -- available in-office or virtually -- please reach out to us  at:  [facility] (First Floor) ?? [address], [place], [place]  ?? [phone] Please leave a message on our lactation voicemail box. We welcome any lactation-related questions or concerns -- our team is here to support you and your baby.  Lactation Support Groups Join us  at: [facility] ?? Tuesdays, 10:00 AM - 12:00 PM ?? [address], [place] [employer], [place]  Lactating parents and lap babies are welcome!  ?? [email or web address]  ?? [email or web address] -------------     Mia Mccoy, IBCLC [facility]

## 2024-09-08 NOTE — Lactation Note (Addendum)
 This note was copied from a baby's chart. Lactation Consultation Note  Patient Name: Mia Mccoy Date: 09/08/2024 Age:30 hours Reason for consult: Follow-up assessment;Early term 37-38.6wks;Infant < 6lbs;Maternal endocrine disorder  P2, Baby [redacted]w[redacted]d < 6 lbs.   Baby has been consuming 12-35 ml of 22 kcal of formula with Nfant white standard nipple. Offered to assist mother and she stated she had planned on eating at this time.  Mother states she pumped one time and did not view volume so she is doubtful of her milk supply.  Provided education.   Mother states she occasionally has attempted at the breast.  Reviewed feeding expectations for early term < 6 lbs infant.  Discussed limiting feedings to 30 min if offering the breast.  Suggest 5-10 min at the breast and the remaining time should be used to bottle feed.  Reviewed the importance of stimulating her milk supply if mother desires to provide breastmilk to her baby.  Reviewed volume expectations with pumping.   Mother states she missed WIC's phone call yesterday and will call them tomorrow. Advised mother to also call her insurance company regarding a breast pump.    Plan:  Limit feedings to 30 min. Offer the breast with feeding cues, ask for help as needed. Pump both breasts for 15 min q 3 hours.  Feed baby any ebm by spoon, finger or bottle. Give supplement after with 22 kcal formula.  Follow volume guidelines. Call for assistance with pumping/latching as desired.    Maternal Data Has patient been taught Hand Expression?: Yes Does the patient have breastfeeding experience prior to this delivery?: Yes  Feeding Mother's Current Feeding Choice: Breast Milk and Formula  Lactation Tools Discussed/Used  DEBP  Interventions Interventions: Education  Discharge Pump: Manual;Advised to call insurance company (Mother states she plans to call [facility] tomorrow.)  Consult Status Consult Status: Follow-up Date:  09/09/24 Follow-up type: In-patient   Shannon Dines Boschen  RN, IBCLC 09/08/2024, 1:15 PM

## 2024-09-08 NOTE — Progress Notes (Signed)
 Post-Op Day 2, repeat CS for failed IOL  Subjective: No complaints, up ad lib, voiding and tolerating PO, passing flatus,small lochia,.plans to breastfeed, no method  Objective: Blood pressure 106/62, pulse 86, temperature 98.2 F (36.8 C), temperature source Oral, resp. rate 18, height 4' 11 (1.499 m), weight 88 kg, last menstrual period 12/19/2023, SpO2 100%, unknown if currently breastfeeding.  Physical Exam:  General: alert, cooperative and no distress Lochia:normal flow Chest: CTAB Heart: RRR no m/r/g Abdomen: +BS, soft, nontender, dsg c/d/intact, no erythema Uterine Fundus: firm DVT Evaluation: No evidence of DVT seen on physical exam. Extremities: trace edema  Recent Labs    09/06/24 0816 09/07/24 0449  HGB 11.9* 10.1*  HCT 36.1 30.9*    Assessment/Plan: Plan for discharge tomorrow, Breastfeeding, and Lactation consult   LOS: 2 days   Cathlean Ely 09/08/2024, 10:58 AM

## 2024-09-09 DIAGNOSIS — Z98891 History of uterine scar from previous surgery: Secondary | ICD-10-CM

## 2024-09-09 MED ORDER — WITCH HAZEL-GLYCERIN EX PADS: 1.0000 | MEDICATED_PAD | CUTANEOUS | 12 refills | Status: AC | PRN

## 2024-09-09 MED ORDER — OXYCODONE HCL 5 MG PO TABS: 5.0000 mg | ORAL_TABLET | ORAL | 0 refills | Status: AC | PRN

## 2024-09-09 MED ORDER — IBUPROFEN 600 MG PO TABS: 600.0000 mg | ORAL_TABLET | Freq: Four times a day (QID) | ORAL | 1 refills | Status: AC

## 2024-09-09 MED ORDER — ACETAMINOPHEN 325 MG PO TABS: 650.0000 mg | ORAL_TABLET | Freq: Four times a day (QID) | ORAL | Status: AC | PRN

## 2024-09-09 MED ORDER — SENNOSIDES-DOCUSATE SODIUM 8.6-50 MG PO TABS: 2.0000 | ORAL_TABLET | Freq: Every evening | ORAL | 1 refills | Status: AC | PRN

## 2024-09-09 MED ORDER — DIBUCAINE (PERIANAL) 1 % EX OINT: 1.0000 | TOPICAL_OINTMENT | CUTANEOUS | Status: AC | PRN

## 2024-09-09 MED ORDER — COCONUT OIL OIL: 1.0000 | TOPICAL_OIL | Status: AC | PRN

## 2024-09-09 MED ORDER — FERROUS SULFATE 325 (65 FE) MG PO TABS: 325.0000 mg | ORAL_TABLET | Freq: Every day | ORAL | 3 refills | Status: AC

## 2024-09-09 NOTE — Lactation Note (Signed)
 This note was copied from a baby's chart. Lactation Consultation Note  Patient Name: Mia Mccoy Date: 09/09/2024 Age:30 hours  Reason for consult: Follow-up assessment;Early term 37-38.6wks;Infant < 6lbs;Maternal endocrine disorder  P2, [redacted]w[redacted]d, regained to birth weight  Follow up [facility] visit with mother and baby Mia Mia Mccoy. Mother was receptive to [facility] visit. She reports she is formula feeding due to baby's weight and being a little early. Mother pumped once and has 15 ml of colostrum in the refrigerator. Discussed the value of mother's milk (colostrum) and the benefits for her baby. Advised to warm milk in the bottle by placing the bottle of EBM in cup of warm water  and bring to room temperature. Feed to baby.   Reviewed milk collection and storage times. Mother states she has noticed her breast are fuller and the left breast has been leaking. She was able to demonstrate hand expression. Discussed breast compression when baby is breastfeeding to increase milk transfer and volume to baby.   Mother does not recall having breast engorgement with her last child, 5 years ago. Discussed management of engorgement, should it occur, and symptoms of mastitis.   OP LC indicated in her note that they were sending a pump referral to a DME and WIC. Mother is not enrolled with WIC at this time. She plans to call to make an appointment. Mother states she can use the hand pump and express milk. Discussed pumped milk in a bottle is another option to provide her baby with her milk.   Mom made aware of O/P services, breastfeeding support groups, community resources, and phone # for post-discharge questions. She is aware of OP LC support at [facility], her baby's pediatrician office, WIC and hospital.  Discussed milk production and maintenance: Discussed the process of milk production, supply and demand and the importance of breast stimulation and milk removal in order to make an optimal milk supply.   Discussed mother to breastfeed 8-12 times in 24 hours, skin to skin and breast feed before formula feeding.  If missed feedings at breast or substituting feeding with formula, advised to hand express and/or pump to remove milk from the breast.     Maternal Data Has patient been taught Hand Expression?: Yes  Feeding Mother's Current Feeding Choice: Breast Milk and Formula Nipple Type: Nfant Standard Flow (white)  LATCH Score  Not observed  Lactation Tools Discussed/Used Breast pump type: Manual Pumped volume: 15 mL  Discharge Discharge Education: Engorgement and breast care;Warning signs for feeding baby;Outpatient recommendation  Consult Status Consult Status: Complete Date: 09/09/24    Joshua Rojelio HERO 09/09/2024, 10:20 AM

## 2024-09-14 ENCOUNTER — Ambulatory Visit: Payer: Self-pay

## 2024-09-15 ENCOUNTER — Telehealth (HOSPITAL_COMMUNITY): Payer: Self-pay | Admitting: *Deleted

## 2024-09-15 NOTE — Telephone Encounter (Signed)
 09/15/2024  Name: Mia Mccoy MRN: [id number] DOB: [birth year]  Reason for Call:  Transition of Care Hospital Discharge Call  Contact Status: Patient Contact Status: Complete  Language assistant needed: Interpreter Mode: Interpreter Not Needed        Follow-Up Questions: Do You Have Any Concerns About Your Health As You Heal From Delivery?: No Do You Have Any Concerns About Your Infants Health?: No  Edinburgh Postnatal Depression Scale:  In the Past 7 Days:    PHQ2-9 Depression Scale:     Discharge Follow-up: Edinburgh score requires follow up?:  (Patient says answers are the same as in the hospital when score was 0. Patient endorses she is doing well emotionally.) Patient was advised of the following resources:: Support Group, Breastfeeding Support Group  Post-discharge interventions: Reviewed Newborn Safe Sleep Practices  Mliss Sieve, RN 09/15/2024 14:20

## 2024-10-20 ENCOUNTER — Ambulatory Visit: Payer: Self-pay | Admitting: Physician Assistant
